# Patient Record
Sex: Female | Born: 1970
Health system: Southern US, Community
[De-identification: ages and names within clinical notes are randomized; demographics above are authoritative.]

## PROBLEM LIST (undated history)

## (undated) DIAGNOSIS — K589 Irritable bowel syndrome without diarrhea: Secondary | ICD-10-CM

## (undated) HISTORY — PX: TUBAL LIGATION: SHX77

## (undated) HISTORY — DX: Irritable bowel syndrome, unspecified: K58.9

---

## 1998-05-07 ENCOUNTER — Emergency Department (HOSPITAL_COMMUNITY): Admission: EM | Admit: 1998-05-07 | Discharge: 1998-05-07 | Payer: Self-pay | Admitting: Emergency Medicine

## 1998-10-26 ENCOUNTER — Emergency Department (HOSPITAL_COMMUNITY): Admission: EM | Admit: 1998-10-26 | Discharge: 1998-10-26 | Payer: Self-pay | Admitting: Emergency Medicine

## 1998-10-26 ENCOUNTER — Encounter: Payer: Self-pay | Admitting: Emergency Medicine

## 2001-11-17 ENCOUNTER — Other Ambulatory Visit: Admission: RE | Admit: 2001-11-17 | Discharge: 2001-11-17 | Payer: Self-pay | Admitting: Obstetrics and Gynecology

## 2005-06-05 ENCOUNTER — Emergency Department (HOSPITAL_COMMUNITY): Admission: EM | Admit: 2005-06-05 | Discharge: 2005-06-05 | Payer: Self-pay | Admitting: Emergency Medicine

## 2006-03-15 ENCOUNTER — Encounter: Admission: RE | Admit: 2006-03-15 | Discharge: 2006-03-15 | Payer: Self-pay | Admitting: Obstetrics & Gynecology

## 2006-03-22 ENCOUNTER — Encounter: Admission: RE | Admit: 2006-03-22 | Discharge: 2006-03-22 | Payer: Self-pay | Admitting: Obstetrics & Gynecology

## 2007-01-09 ENCOUNTER — Emergency Department (HOSPITAL_COMMUNITY): Admission: EM | Admit: 2007-01-09 | Discharge: 2007-01-09 | Payer: Self-pay | Admitting: Family Medicine

## 2009-06-13 ENCOUNTER — Encounter (INDEPENDENT_AMBULATORY_CARE_PROVIDER_SITE_OTHER): Payer: Self-pay | Admitting: *Deleted

## 2009-08-17 ENCOUNTER — Ambulatory Visit: Payer: Self-pay | Admitting: Internal Medicine

## 2009-08-17 DIAGNOSIS — F17200 Nicotine dependence, unspecified, uncomplicated: Secondary | ICD-10-CM | POA: Insufficient documentation

## 2009-08-17 DIAGNOSIS — N200 Calculus of kidney: Secondary | ICD-10-CM | POA: Insufficient documentation

## 2009-08-17 DIAGNOSIS — R1032 Left lower quadrant pain: Secondary | ICD-10-CM | POA: Insufficient documentation

## 2009-08-18 ENCOUNTER — Ambulatory Visit: Payer: Self-pay | Admitting: Cardiology

## 2009-09-13 ENCOUNTER — Telehealth: Payer: Self-pay | Admitting: Internal Medicine

## 2009-09-15 ENCOUNTER — Ambulatory Visit: Payer: Self-pay | Admitting: Internal Medicine

## 2009-09-23 ENCOUNTER — Ambulatory Visit: Payer: Self-pay | Admitting: Internal Medicine

## 2009-09-23 ENCOUNTER — Ambulatory Visit (HOSPITAL_COMMUNITY): Admission: RE | Admit: 2009-09-23 | Discharge: 2009-09-23 | Payer: Self-pay | Admitting: Internal Medicine

## 2009-09-23 ENCOUNTER — Encounter: Payer: Self-pay | Admitting: Internal Medicine

## 2009-09-26 ENCOUNTER — Encounter: Payer: Self-pay | Admitting: Internal Medicine

## 2009-11-14 ENCOUNTER — Ambulatory Visit: Payer: Self-pay | Admitting: Internal Medicine

## 2010-12-03 ENCOUNTER — Encounter: Payer: Self-pay | Admitting: Obstetrics & Gynecology

## 2010-12-12 NOTE — Assessment & Plan Note (Signed)
Summary: F.U AFTER COL ..E.M   History of Present Illness Visit Type: follow up  Primary GI MD: Lina Sar MD Primary Provider: Robert Bellow, MD Requesting Provider: n/a Chief Complaint: F/u from colon. Pt still having loose stools and bloating  History of Present Illness:   This is a 40 year old white female with irritable bowel syndrome and intermittent bloating as well as diarrhea. She was started on Paxil 10 mg daily and she is quite happy with the results. However her bloating  continues. A CT scan of the abdomen and pelvis was negative and her colonoscopy including terminal ileum biopsies was  negative. She has not responded to Bentyl. She feels that her symptoms are related to IBS. She would like to try modifying her diet and try  over-the-counter medications.   GI Review of Systems    Reports bloating.      Denies abdominal pain, acid reflux, belching, chest pain, dysphagia with liquids, dysphagia with solids, heartburn, loss of appetite, nausea, vomiting, vomiting blood, weight loss, and  weight gain.        Denies anal fissure, black tarry stools, change in bowel habit, constipation, diarrhea, diverticulosis, fecal incontinence, heme positive stool, hemorrhoids, irritable bowel syndrome, jaundice, light color stool, liver problems, rectal bleeding, and  rectal pain.    Current Medications (verified): 1)  Depakote 125 Mg Tbec (Divalproex Sodium) .... Preventative For Migraines As Needed 2)  Relpax 20 Mg Tabs (Eletriptan Hydrobromide) .... As Needed 3)  Kariva 0.15-0.02/0.01 Mg (21/5) Tabs (Desogestrel-Ethinyl Estradiol) .... As Directed 4)  Bentyl 10 Mg Caps (Dicyclomine Hcl) .... As Needed 5)  Excedrin Extra Strength [phone] Mg Tabs (Aspirin-Acetaminophen-Caffeine) .... As Needed 6)  Aleve 220 Mg Tabs (Naproxen Sodium) .... As Needed 7)  Paxil(Dosage Unknown) .... One Tablet By Mouth Once Daily  Allergies (verified): No Known Drug Allergies  Past History:  Past Medical  History: Reviewed history from 08/17/2009 and no changes required. Irritable Bowel Syndrome Kidney Stones  Past Surgical History: Reviewed history from 08/17/2009 and no changes required. Unremarkable  Family History: Family History of Breast Cancer:Mother Family History of Uterine Cancer:Mother Family History of Colitis/Crohn's:Father Family History of Diabetes: GM No FH of Colon Cancer:  Social History: Reviewed history from 08/17/2009 and no changes required. Occupation: Paramedic Patient currently smokes. 1/2 ppd Alcohol Use - no Daily Caffeine Use -3 Illicit Drug Use - no  Review of Systems  The patient denies allergy/sinus, anemia, anxiety-new, arthritis/joint pain, back pain, blood in urine, breast changes/lumps, change in vision, confusion, cough, coughing up blood, depression-new, fainting, fatigue, fever, headaches-new, hearing problems, heart murmur, heart rhythm changes, itching, menstrual pain, muscle pains/cramps, night sweats, nosebleeds, pregnancy symptoms, shortness of breath, skin rash, sleeping problems, sore throat, swelling of feet/legs, swollen lymph glands, thirst - excessive , urination - excessive , urination changes/pain, urine leakage, vision changes, and voice change.         Pertinent positive and negative review of systems were noted in the above HPI. All other ROS was otherwise negative.   Vital Signs:  Patient profile:   40 year old female Height:      66 inches Weight:      129 pounds BMI:     20.90 BSA:     1.66 Pulse rate:   84 / minute Pulse rhythm:   regular BP sitting:   112 / 72  (left arm) Cuff size:   regular  Vitals Entered By: Ok Anis CMA (November 14, 2009 11:05 AM)  Impression & Recommendations:  Problem # 1:  ABDOMINAL PAIN, LEFT LOWER QUADRANT (ICD-789.04) dyspepsia bloating and abdominal pain likely due to   irritable bowel syndrome. I have suggested that she uses  probiotics or pancreatic enzyme supplement such as  Phazyme-95. I have also suggested charcoal or Pepto-Bismol p.r.n. for exccessive gas absorption. She  will continue on Paxil 10 mg daily and will return p.r.n.  Patient Instructions: 1)  high-fiber diet. 2)  Paxil 10 mg daily. 3)  Trial of probiotics or pancreatic enzyme supplements such as Phazyme -95 4)  Samples of Amitiza 8 mcg take one a day on p.r.n. basis, will call if prescription needed. 5)  Copy sent to : Dr Robert Bellow

## 2010-12-15 ENCOUNTER — Emergency Department (HOSPITAL_BASED_OUTPATIENT_CLINIC_OR_DEPARTMENT_OTHER)
Admission: EM | Admit: 2010-12-15 | Discharge: 2010-12-16 | Disposition: A | Discharge status: Home / Self Care | Payer: 59 | Attending: Emergency Medicine | Admitting: Emergency Medicine

## 2010-12-15 DIAGNOSIS — G43801 Other migraine, not intractable, with status migrainosus: Secondary | ICD-10-CM | POA: Insufficient documentation

## 2010-12-15 DIAGNOSIS — Z79899 Other long term (current) drug therapy: Secondary | ICD-10-CM | POA: Insufficient documentation

## 2010-12-15 DIAGNOSIS — K5090 Crohn's disease, unspecified, without complications: Secondary | ICD-10-CM | POA: Insufficient documentation

## 2011-11-23 ENCOUNTER — Encounter (INDEPENDENT_AMBULATORY_CARE_PROVIDER_SITE_OTHER): Payer: 59 | Admitting: Family Medicine

## 2011-11-23 DIAGNOSIS — N942 Vaginismus: Secondary | ICD-10-CM

## 2011-11-23 DIAGNOSIS — Z309 Encounter for contraceptive management, unspecified: Secondary | ICD-10-CM

## 2011-11-23 DIAGNOSIS — Z0000 Encounter for general adult medical examination without abnormal findings: Secondary | ICD-10-CM

## 2011-11-23 DIAGNOSIS — N760 Acute vaginitis: Secondary | ICD-10-CM

## 2011-11-23 LAB — BASIC METABOLIC PANEL: BUN: 10 mg/dL (ref 4–21)

## 2011-11-23 LAB — HEPATIC FUNCTION PANEL
AST: 14 U/L (ref 13–35)
Alkaline Phosphatase: 44 U/L (ref 25–125)
Bilirubin, Total: 0.3 mg/dL

## 2011-11-23 LAB — CBC AND DIFFERENTIAL
HCT: 41 % (ref 36–46)
Hemoglobin: 14.4 g/dL (ref 12.0–16.0)
Platelets: 250 10*3/uL (ref 150–399)

## 2011-12-28 ENCOUNTER — Ambulatory Visit (INDEPENDENT_AMBULATORY_CARE_PROVIDER_SITE_OTHER): Payer: 59 | Admitting: Family Medicine

## 2011-12-28 ENCOUNTER — Encounter: Payer: Self-pay | Admitting: Family Medicine

## 2011-12-28 VITALS — BP 113/71 | HR 79 | Temp 98.4°F | Resp 16 | Ht 64.75 in | Wt 128.6 lb

## 2011-12-28 DIAGNOSIS — Z309 Encounter for contraceptive management, unspecified: Secondary | ICD-10-CM

## 2011-12-28 DIAGNOSIS — Z01419 Encounter for gynecological examination (general) (routine) without abnormal findings: Secondary | ICD-10-CM

## 2011-12-28 DIAGNOSIS — Z3040 Encounter for surveillance of contraceptives, unspecified: Secondary | ICD-10-CM

## 2011-12-28 MED ORDER — DESOGESTREL-ETHINYL ESTRADIOL 0.15-0.02/0.01 MG (21/5) PO TABS: 1.0000 | ORAL_TABLET | Freq: Every day | ORAL | Status: DC

## 2011-12-28 NOTE — Patient Instructions (Signed)
Your Oral Contraceptive has been refilled for 12 months. Please contact the office if you have any problems.    Pap Test A Pap test is a sampling of cells from a woman's cervix. The cervix is the opening between the vagina (birth canal) and the uterus (the bottom part of the womb). The cells are scraped from the cervix during a pelvic exam. These cells are then looked at under a microscope to see if the cells are normal or to see if a cancer is developing or there are changes that suggest a cancer will develop. Cervical dysplasia is a condition in which a woman has abnormal changes in the top layer of cells of her cervix. These changes are an early sign that cervical cancer may develop. Pap tests also look for the human papilloma virus (HPV) because it has 4 types that are responsible for 70% of cervical cancer. Infections can also be found during a Pap test such as bacteria, fungus, protozoa and viruses.  Cervical cancer is harder to treat and less likely to have a good outcome if left untreated. Catching the disease at an early stage leads to a better outcome. Since the Pap test was introduced 60 years ago, deaths from cervical cancer have decreased by 70%. Every woman should keep up to date with Pap tests. RISK FACTORS FOR CERVICAL CANCER INCLUDE:   Becoming sexually active before age 66.   Being the daughter of a woman who took diethylstilbestrol (DES) during pregnancy.   Having a sexual partner who has or has had cancer of the penis.   Having a sexual partner whose past partner had cervical cancer or cervical dysplasia (early cell changes which suggest a cancer may develop).   Having a weakened immune system. An example would be HIV or other immunodeficiency disorder.   Having had a sexually transmitted infection such as chlamydia, gonorrhea or HPV.   Having had an abnormal Pap or cancer of the vagina or vulva.   Having had more than one sexual partner.   A history of cervical cancer in  a woman's sister or mother.   Not using condoms with new sexual partners.   Smoking.  WHO SHOULD HAVE PAP TESTS  A Pap test is done to screen for cervical cancer.   The first Pap test should be done at age 45.   Between ages 6 and 49, Pap tests are repeated every 2 years.   Beginning at age 37, you are advised to have a Pap test every 3 years as long as your past 3 Pap tests have been normal.   Some women have medical problems that increase the chance of getting cervical cancer. Talk to your caregiver about these problems. It is especially important to talk to your caregiver if a new problem develops soon after your last Pap test. In these cases, your caregiver may recommend more frequent screening and Pap tests.   The above recommendations are the same for women who have or have not gotten the vaccine for HPV (Human Papillomavirus).   If you had a hysterectomy for a problem that was not a cancer or a condition that could lead to cancer, then you no longer need Pap tests. However, even if you no longer need a Pap test, a regular exam is a good idea to make sure no other problems are starting.    If you are between ages 90 and 1, and you have had normal Pap tests going back 10 years, you no  longer need Pap tests. However, even if you no longer need a Pap test, a regular exam is a good idea to make sure no other problems are starting.    If you have had past treatment for cervical cancer or a condition that could lead to cancer, you need Pap tests and screening for cancer for at least 20 years after your treatment.   If Pap tests have been discontinued, risk factors (such as a new sexual partner) need to be re-assessed to determine if screening should be resumed.   Some women may need screenings more often if they are at high risk for cervical cancer.  PREPARATION FOR A PAP TEST A Pap test should be performed during the weeks before the start of menstruation. Women should not  douche or have sexual intercourse for 24 hours before the test. No vaginal creams, diaphragms, or tampons should be used for 24 hours before the test. To minimize discomfort, a woman should empty her bladder just before the exam. TAKING THE PAP TEST The caregiver will perform a pelvic exam. A metal or plastic instrument (speculum) is placed in the vagina. This is done before your caregiver does a bimanual exam of your internal female organs. This instrument allows your caregiver to see the inside of the vagina and look at the cervix. A small, sterile brush is used to take a sample of cells from the internal opening of the cervix. A small wooden spatula is used to scrape the outside of the cervix. Neither of these two methods to collect cells will cause you pain. These two scrapings are placed on a glass slide or in a small bottle filled with a special liquid. The cells are looked at later under a microscope in a lab. A specialist will look at these cells and determine if the cells are normal. RESULTS OF YOUR PAP TEST  A healthy Pap test shows no abnormal cells or evidence of inflammation.   The presence of abnormally growing cells on the surface of the cervix may be reported as an abnormal Pap test. Different categories of findings are used to describe your Pap test. Your caregiver will go over the importance of these findings with you. The caregiver will then determine what follow-up is needed or when you should have your next pap test.   If you have had two or more abnormal Pap tests:   You may be asked to have a colposcopy. This is a test in which the cervix is viewed with a special lighted microscope.   A cervical tissue sample (biopsy) may also be needed. This involves taking a small tissue sample from the cervix. The sample is looked at under a microscope to find the cause of the abnormal cells. Make sure you find out the results of the Pap test. If you have not received the results within two  weeks, contact your caregiver's office for the results. Do not assume everything is normal if you have not heard from your caregiver or medical facility. It is important to follow up on all of your test results.  Document Released: 01/19/2003 Document Revised: 07/11/2011 Document Reviewed: 03/22/2008 [facility] Patient Information 2012 [place], [place].

## 2011-12-28 NOTE — Progress Notes (Signed)
  Subjective:    Patient ID: Jennifer French, female    DOB: 10/02/1971, 41 y.o.   MRN: [id number]  HPI  This 41 y.o Cauc female returns today for PAP; last visit, exam could not be performed due to excessive   vaginal discharge and pain. The pt reports normal period, no intercourse, no abnormal discharge.  She requests RF for OCPs which she is taking w/o problems.    Review of Systems Noncontributory     Objective:   Physical Exam  Nursing note and vitals reviewed. Constitutional: She appears well-developed and well-nourished.  HENT:  Head: Normocephalic and atraumatic.  Genitourinary: Vagina normal and uterus normal. There is no rash, tenderness or lesion on the right labia. There is no rash, tenderness or lesion on the left labia. Cervix exhibits no motion tenderness, no discharge and no friability. Right adnexum displays no mass and no tenderness. Left adnexum displays no mass and no tenderness. No erythema, tenderness or bleeding around the vagina. No vaginal discharge found.    Pap collected      Assessment & Plan:   1. Pap smear, as part of routine gynecological examination  Pap IG, CT/NG w/ reflex HPV when ASC-U  2. Contraception management  Rx. Kariva (generic) x 12 months.

## 2012-01-01 NOTE — Progress Notes (Signed)
Quick Note:  Notify pt of Normal results. ______ 

## 2012-01-02 NOTE — Progress Notes (Signed)
Quick Note:  Notify pt of Normal results. ______ 

## 2012-06-18 ENCOUNTER — Ambulatory Visit (INDEPENDENT_AMBULATORY_CARE_PROVIDER_SITE_OTHER): Payer: 59 | Admitting: Emergency Medicine

## 2012-06-18 VITALS — BP 116/78 | HR 93 | Temp 98.7°F | Resp 16 | Ht 65.25 in | Wt 121.0 lb

## 2012-06-18 DIAGNOSIS — B029 Zoster without complications: Secondary | ICD-10-CM

## 2012-06-18 MED ORDER — VALACYCLOVIR HCL 1 G PO TABS: 1000.0000 mg | ORAL_TABLET | Freq: Three times a day (TID) | ORAL | Status: AC

## 2012-06-18 NOTE — Progress Notes (Signed)
  Subjective:    Patient ID: Jennifer French, female    DOB: 12/10/1970, 41 y.o.   MRN: [id number]  HPI History of previous outbreak of shingles.  Now has recurrent outbreak on right arm.  No fever or chills   Review of Systems As per HPI, otherwise negative.       Objective:   Physical Exam   GEN: WDWN, NAD, Non-toxic, Alert & Oriented x 3 HEENT: Atraumatic, Normocephalic.  Ears and Nose: No external deformity. EXTR: No clubbing/cyanosis/edema NEURO: Normal gait.  PSYCH: Normally interactive. Conversant. Not depressed or anxious appearing.  Calm demeanor.  Skin shingles rash on right upper arm      Assessment & Plan:  Shingles famvir vicodin Follow up as needed

## 2012-11-03 ENCOUNTER — Other Ambulatory Visit: Payer: Self-pay | Admitting: Family Medicine

## 2012-11-19 ENCOUNTER — Ambulatory Visit (INDEPENDENT_AMBULATORY_CARE_PROVIDER_SITE_OTHER): Payer: 59 | Admitting: Family Medicine

## 2012-11-19 ENCOUNTER — Ambulatory Visit: Payer: 59

## 2012-11-19 ENCOUNTER — Encounter: Payer: Self-pay | Admitting: Physician Assistant

## 2012-11-19 VITALS — BP 120/75 | HR 88 | Temp 99.1°F | Resp 18 | Wt 123.0 lb

## 2012-11-19 DIAGNOSIS — M546 Pain in thoracic spine: Secondary | ICD-10-CM

## 2012-11-19 DIAGNOSIS — M549 Dorsalgia, unspecified: Secondary | ICD-10-CM

## 2012-11-19 DIAGNOSIS — Z3041 Encounter for surveillance of contraceptive pills: Secondary | ICD-10-CM

## 2012-11-19 MED ORDER — HYDROCODONE-ACETAMINOPHEN 5-325 MG PO TABS: 1.0000 | ORAL_TABLET | Freq: Three times a day (TID) | ORAL | Status: DC | PRN

## 2012-11-19 MED ORDER — DESOGESTREL-ETHINYL ESTRADIOL 0.15-0.02/0.01 MG (21/5) PO TABS: 1.0000 | ORAL_TABLET | Freq: Every day | ORAL | Status: DC

## 2012-11-19 MED ORDER — CYCLOBENZAPRINE HCL 5 MG PO TABS: 5.0000 mg | ORAL_TABLET | Freq: Three times a day (TID) | ORAL | Status: DC | PRN

## 2012-11-19 MED ORDER — MELOXICAM 15 MG PO TABS: 15.0000 mg | ORAL_TABLET | Freq: Every day | ORAL | Status: DC

## 2012-11-19 NOTE — Patient Instructions (Addendum)
[facility] [address] [place], [place], [place], [phone] [facility] - [address], [place], [place] [address], [phone]

## 2012-11-19 NOTE — Progress Notes (Signed)
[address] [place] [address]   Phone [phone]  Subjective:    Patient ID: Jennifer French, female    DOB: 1971-05-01, 42 y.o.   MRN: [id number]  HPI  Pt presents to clinic with 6 month h/o mid to low back pain.  The pain started just after her shifts at work which include heavy lifting and bending over.  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  She is still having her back pain that is definitely worse with increased activity.  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  She has been using her Vicodin for her headaches for her terrible back pain.  She has used some of her dads muscle relaxers and that gives her some relief from her pain.  She uses heat and that really helps with her pain.  She has not had any injury that she knows of to her back.  The pain is on both sides and not over the bones of her back.  She really feels this is muscle related but she just cannot deal with the pain anymore.  Sometimes the pain goes into her hips but never into her legs.  She has no paresthesias.  She also needs a refill on her birth control pills.  She is not interested in becoming pregnant and she has no kids.  She would like to know if there are other longer term methods - she has no trouble taking her pills and she thinks that they may help with her headaches.  Review of Systems  Musculoskeletal: Positive for back pain. Negative for gait problem.       Objective:   Physical Exam  Vitals reviewed. Constitutional: She is oriented to person, place, and time. She appears well-developed and well-nourished.  HENT:  Head: Normocephalic and atraumatic.  Right Ear: External ear normal.  Left Ear: External ear normal.  Pulmonary/Chest: Effort normal.  Musculoskeletal:       Thoracic back: She exhibits spasm. She exhibits normal range of motion and no bony tenderness.       Back:  Neurological: She is alert and oriented to  person, place, and time. She has normal reflexes.  Skin: Skin is warm and dry.  Psychiatric: She has a normal mood and affect. Her behavior is normal. Judgment and thought content normal.   [facility] reading (PRIMARY) by  Dr. Neva Seat.  Mild scoliosis mid thoracic area.      Assessment & Plan:   1. Thoracic back pain  DG Lumbar Spine 2-3 Views, DG Thoracic Spine 2 View, cyclobenzaprine (FLEXERIL) 5 MG tablet, HYDROcodone-acetaminophen (NORCO/VICODIN) 5-325 MG per tablet, meloxicam (MOBIC) 15 MG tablet  2. Back pain  DG Lumbar Spine 2-3 Views, DG Thoracic Spine 2 View, cyclobenzaprine (FLEXERIL) 5 MG tablet, HYDROcodone-acetaminophen (NORCO/VICODIN) 5-325 MG per tablet, meloxicam (MOBIC) 15 MG tablet  3. 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  She has typical musculoskeletal pain that has been relieved with muscle relaxers.  She should continue heat and start above meds.  Names of chiropractors were given to her because she is interested in that route to help with pain.  Back manual for home exercise was given to patient.  She should start to work on core exercises to help support her back. 3- d/w pt  other birth control options - if she is interested in the future she will call for a referral but for now she wants to continue with OCP.

## 2012-11-20 ENCOUNTER — Encounter: Payer: Self-pay | Admitting: Physician Assistant

## 2012-11-21 NOTE — Progress Notes (Signed)
Xray read and patient discussed with Sarah Weber, PA-C. Agree with assessment and plan of care per her note.   

## 2012-12-01 ENCOUNTER — Encounter: Payer: Self-pay | Admitting: Physician Assistant

## 2012-12-01 DIAGNOSIS — G43909 Migraine, unspecified, not intractable, without status migrainosus: Secondary | ICD-10-CM | POA: Insufficient documentation

## 2012-12-01 DIAGNOSIS — K589 Irritable bowel syndrome without diarrhea: Secondary | ICD-10-CM | POA: Insufficient documentation

## 2012-12-20 ENCOUNTER — Other Ambulatory Visit: Payer: Self-pay | Admitting: Physician Assistant

## 2012-12-22 NOTE — Telephone Encounter (Signed)
At Tl desk 

## 2013-01-14 ENCOUNTER — Encounter: Payer: Self-pay | Admitting: Physician Assistant

## 2013-01-14 ENCOUNTER — Ambulatory Visit (INDEPENDENT_AMBULATORY_CARE_PROVIDER_SITE_OTHER): Payer: 59 | Admitting: Physician Assistant

## 2013-01-14 VITALS — BP 121/73 | HR 98 | Temp 99.2°F | Resp 16 | Ht 65.0 in | Wt 125.6 lb

## 2013-01-14 DIAGNOSIS — M549 Dorsalgia, unspecified: Secondary | ICD-10-CM

## 2013-01-14 DIAGNOSIS — R1032 Left lower quadrant pain: Secondary | ICD-10-CM

## 2013-01-14 DIAGNOSIS — M546 Pain in thoracic spine: Secondary | ICD-10-CM

## 2013-01-14 MED ORDER — HYDROCODONE-ACETAMINOPHEN 5-325 MG PO TABS: 1.0000 | ORAL_TABLET | Freq: Once | ORAL | Status: DC

## 2013-01-14 MED ORDER — CYCLOBENZAPRINE HCL 5 MG PO TABS: 5.0000 mg | ORAL_TABLET | Freq: Three times a day (TID) | ORAL | Status: DC | PRN

## 2013-01-14 MED ORDER — MELOXICAM 15 MG PO TABS: 15.0000 mg | ORAL_TABLET | Freq: Every day | ORAL | Status: DC

## 2013-01-14 NOTE — Progress Notes (Signed)
   [address], [place] [address]   Phone [phone]  Subjective:    Patient ID: Jennifer French, female    DOB: 26-Mar-1971, 42 y.o.   MRN: [id number]  HPI Pt presents to clinic for recheck. Her back pain is better but she can only take the flexeril in the afternoon and night because in the am it just makes her to tired.  In the am she wakes up (she is comfortable when she sleeps and she has a really good mattress) but she is so stiff and it hurts to move.  She ends up taking a 1/2 Norco and she can get moving, without it she hurts to much.  She has done some back exercises but has not been to PT or chiro but is open to it if it will maybe help her pain.  The pain has not changed from her last visit.   Review of Systems  Musculoskeletal: Positive for back pain.       Objective:   Physical Exam  Vitals reviewed. Constitutional: She is oriented to person, place, and time. She appears well-developed and well-nourished.  Pulmonary/Chest: Effort normal.  Neurological: She is alert and oriented to person, place, and time.  Skin: Skin is warm and dry.  Psychiatric: She has a normal mood and affect. Her behavior is normal. Judgment and thought content normal.      Assessment & Plan:  Thoracic back pain - Plan: cyclobenzaprine (FLEXERIL) 5 MG tablet, meloxicam (MOBIC) 15 MG tablet, HYDROcodone-acetaminophen (NORCO/VICODIN) 5-325 MG per tablet, Ambulatory referral to Physical Therapy  Back pain - Plan: cyclobenzaprine (FLEXERIL) 5 MG tablet, meloxicam (MOBIC) 15 MG tablet, HYDROcodone-acetaminophen (NORCO/VICODIN) 5-325 MG per tablet, Ambulatory referral to Physical Therapy  D/w pt that we would rather not have her need to take Norco daily and will send her for PT in hopes that we can improve her muscle strength and decrease her pain.

## 2013-01-17 ENCOUNTER — Other Ambulatory Visit: Payer: Self-pay | Admitting: Physician Assistant

## 2013-03-06 ENCOUNTER — Telehealth: Payer: Self-pay

## 2013-03-06 DIAGNOSIS — M546 Pain in thoracic spine: Secondary | ICD-10-CM

## 2013-03-06 DIAGNOSIS — M549 Dorsalgia, unspecified: Secondary | ICD-10-CM

## 2013-03-06 MED ORDER — HYDROCODONE-ACETAMINOPHEN 5-325 MG PO TABS: 1.0000 | ORAL_TABLET | Freq: Once | ORAL | Status: DC

## 2013-03-06 NOTE — Telephone Encounter (Signed)
Patient would like [place] to call in a hydrocodone refill to [facility]. She changed pharmacy and since they don't have any history on the patient they need [place] to call them for the refill.

## 2013-03-06 NOTE — Telephone Encounter (Signed)
[facility]- [place]. Called rx into pharm- pt aware.

## 2013-03-06 NOTE — Telephone Encounter (Signed)
Med is ready to be called into the pharmacy.  Please ask her how the PT is doing and has it helped.

## 2013-04-30 ENCOUNTER — Telehealth: Payer: Self-pay | Admitting: Radiology

## 2013-04-30 DIAGNOSIS — M546 Pain in thoracic spine: Secondary | ICD-10-CM

## 2013-04-30 DIAGNOSIS — M549 Dorsalgia, unspecified: Secondary | ICD-10-CM

## 2013-04-30 MED ORDER — HYDROCODONE-ACETAMINOPHEN 5-325 MG PO TABS: 1.0000 | ORAL_TABLET | Freq: Once | ORAL | Status: DC

## 2013-04-30 NOTE — Telephone Encounter (Signed)
Please advise on refill of hydrocodone received fax from [facility]

## 2013-04-30 NOTE — Telephone Encounter (Signed)
At Tl desk 

## 2013-05-01 ENCOUNTER — Other Ambulatory Visit: Payer: Self-pay | Admitting: Radiology

## 2013-05-01 NOTE — Telephone Encounter (Signed)
Verified sig/ one q 8 hrs.

## 2013-05-01 NOTE — Telephone Encounter (Signed)
Faxed

## 2013-06-03 ENCOUNTER — Telehealth: Payer: Self-pay

## 2013-06-03 DIAGNOSIS — M549 Dorsalgia, unspecified: Secondary | ICD-10-CM

## 2013-06-03 DIAGNOSIS — M546 Pain in thoracic spine: Secondary | ICD-10-CM

## 2013-06-03 NOTE — Telephone Encounter (Signed)
[facility] pharmacy is calling to request a refill on hydrocodone for this pt. Best# [phone]

## 2013-06-04 NOTE — Telephone Encounter (Signed)
Please advise 

## 2013-06-05 MED ORDER — HYDROCODONE-ACETAMINOPHEN 5-325 MG PO TABS: 1.0000 | ORAL_TABLET | Freq: Once | ORAL | Status: DC

## 2013-06-05 NOTE — Addendum Note (Signed)
Addended by: Morrell Riddle on: 06/05/2013 11:46 AM   Modules accepted: Orders

## 2013-06-05 NOTE — Telephone Encounter (Signed)
Ready

## 2013-06-05 NOTE — Telephone Encounter (Signed)
faxed

## 2013-07-17 ENCOUNTER — Other Ambulatory Visit: Payer: Self-pay | Admitting: Physician Assistant

## 2013-07-18 ENCOUNTER — Telehealth: Payer: Self-pay

## 2013-07-18 DIAGNOSIS — M549 Dorsalgia, unspecified: Secondary | ICD-10-CM

## 2013-07-18 DIAGNOSIS — M546 Pain in thoracic spine: Secondary | ICD-10-CM

## 2013-07-18 NOTE — Telephone Encounter (Signed)
Pt is going out of town needs refill on hydrocodone. This time [facility] [address] because her usual pharmacy is closed tomorrow. Please call when ready at [phone].

## 2013-07-20 ENCOUNTER — Telehealth: Payer: Self-pay

## 2013-07-20 MED ORDER — HYDROCODONE-ACETAMINOPHEN 5-325 MG PO TABS: 1.0000 | ORAL_TABLET | Freq: Once | ORAL | Status: DC

## 2013-07-20 NOTE — Telephone Encounter (Signed)
Pt states she is returning a call made to her today. Unable to answer phone in time.  I don't see documentation of who called pt and clinical tl unavailable at the time of this call. Pt does have a previous message about rx refill. Please call pt.  [phone]  bf

## 2013-07-20 NOTE — Telephone Encounter (Signed)
Pt called back and advised she would like her Rx to be sent to [facility] if possible. Also stated that she is due to go out of town tomorrow and would love to get Rx filled tonight if possible.

## 2013-07-20 NOTE — Telephone Encounter (Signed)
I did not call her. Looks like Jennifer French called her about the Rx. To Jennifer French

## 2013-07-20 NOTE — Telephone Encounter (Signed)
See notes under Refill encounter.

## 2013-07-20 NOTE — Telephone Encounter (Signed)
Ready

## 2013-07-20 NOTE — Telephone Encounter (Signed)
Received a req for RF of hydrocodone from [facility]. Sarah, since original call from pt was on Sat and she was referring to [facility] being closed on Sunday, I assume she would rather have it go to [facility] now. I called pt and advised her that I will have Sarah send to [facility] if approved and asked pt to CB if she still wants [facility].

## 2013-07-21 ENCOUNTER — Telehealth: Payer: Self-pay | Admitting: Radiology

## 2013-07-21 NOTE — Telephone Encounter (Signed)
I fixed it

## 2013-07-21 NOTE — Telephone Encounter (Signed)
Faxed to [facility], advised Rx faxed. Pt understood.

## 2013-07-21 NOTE — Telephone Encounter (Signed)
Next time you see this patient or send in her hydrocodone, please change sig, it was sent with sig to take once, I have changed at pharmacy last Rx and this Rx to you [facility]

## 2013-07-29 ENCOUNTER — Ambulatory Visit (INDEPENDENT_AMBULATORY_CARE_PROVIDER_SITE_OTHER): Payer: 59 | Admitting: Physician Assistant

## 2013-07-29 ENCOUNTER — Encounter: Payer: Self-pay | Admitting: Physician Assistant

## 2013-07-29 VITALS — BP 113/71 | HR 93 | Temp 99.3°F | Resp 17 | Wt 130.0 lb

## 2013-07-29 DIAGNOSIS — N949 Unspecified condition associated with female genital organs and menstrual cycle: Secondary | ICD-10-CM

## 2013-07-29 DIAGNOSIS — N3289 Other specified disorders of bladder: Secondary | ICD-10-CM

## 2013-07-29 DIAGNOSIS — M549 Dorsalgia, unspecified: Secondary | ICD-10-CM

## 2013-07-29 DIAGNOSIS — Z1231 Encounter for screening mammogram for malignant neoplasm of breast: Secondary | ICD-10-CM

## 2013-07-29 DIAGNOSIS — R3989 Other symptoms and signs involving the genitourinary system: Secondary | ICD-10-CM

## 2013-07-29 DIAGNOSIS — M546 Pain in thoracic spine: Secondary | ICD-10-CM

## 2013-07-29 DIAGNOSIS — F411 Generalized anxiety disorder: Secondary | ICD-10-CM

## 2013-07-29 DIAGNOSIS — N898 Other specified noninflammatory disorders of vagina: Secondary | ICD-10-CM

## 2013-07-29 DIAGNOSIS — Z0000 Encounter for general adult medical examination without abnormal findings: Secondary | ICD-10-CM

## 2013-07-29 DIAGNOSIS — R102 Pelvic and perineal pain: Secondary | ICD-10-CM

## 2013-07-29 LAB — CBC WITH DIFFERENTIAL/PLATELET
Basophils Absolute: 0 10*3/uL (ref 0.0–0.1)
Lymphocytes Relative: 48 % — ABNORMAL HIGH (ref 12–46)
Neutro Abs: 4.9 10*3/uL (ref 1.7–7.7)
Neutrophils Relative %: 43 % (ref 43–77)
Platelets: 281 10*3/uL (ref 150–400)
RDW: 12.9 % (ref 11.5–15.5)
WBC: 11.5 10*3/uL — ABNORMAL HIGH (ref 4.0–10.5)

## 2013-07-29 LAB — POCT WET PREP WITH KOH: Trichomonas, UA: NEGATIVE

## 2013-07-29 LAB — POCT URINALYSIS DIPSTICK
Bilirubin, UA: NEGATIVE
Leukocytes, UA: NEGATIVE
Nitrite, UA: NEGATIVE
Protein, UA: NEGATIVE
pH, UA: 6.5

## 2013-07-29 MED ORDER — METRONIDAZOLE 500 MG PO TABS: 500.0000 mg | ORAL_TABLET | Freq: Two times a day (BID) | ORAL | Status: AC

## 2013-07-29 MED ORDER — BUPROPION HCL ER (XL) 150 MG PO TB24: 150.0000 mg | ORAL_TABLET | Freq: Every day | ORAL | Status: DC

## 2013-07-29 MED ORDER — LIDOCAINE HCL 2 % EX GEL: CUTANEOUS | Status: DC | PRN

## 2013-07-29 MED ORDER — HYDROCODONE-ACETAMINOPHEN 5-325 MG PO TABS: 1.0000 | ORAL_TABLET | Freq: Every day | ORAL | Status: DC | PRN

## 2013-07-29 MED ORDER — ALPRAZOLAM 0.25 MG PO TABS: 0.2500 mg | ORAL_TABLET | Freq: Every day | ORAL | Status: DC | PRN

## 2013-07-29 MED ORDER — MELOXICAM 15 MG PO TABS: 15.0000 mg | ORAL_TABLET | Freq: Every day | ORAL | Status: DC

## 2013-07-29 NOTE — Patient Instructions (Signed)
Send to Dr Audie Box - [place] [facility]  -try lidocaine gel  -Xanax before the appt and ultrasound  Send for mammogram  For back   - more hydrocodone and flexeril  -refer to [facility]  For smoking  -wellbutrin 150mg  daily for 1 wk and then increase to 2 pills daily - call me in 1 month

## 2013-07-30 ENCOUNTER — Telehealth: Payer: Self-pay

## 2013-07-30 LAB — TSH: TSH: 1.302 u[IU]/mL (ref 0.350–4.500)

## 2013-07-30 LAB — COMPREHENSIVE METABOLIC PANEL
ALT: 14 U/L (ref 0–35)
AST: 15 U/L (ref 0–37)
Chloride: 105 mEq/L (ref 96–112)
Creat: 0.77 mg/dL (ref 0.50–1.10)
Sodium: 137 mEq/L (ref 135–145)
Total Bilirubin: 0.2 mg/dL — ABNORMAL LOW (ref 0.3–1.2)
Total Protein: 6.9 g/dL (ref 6.0–8.3)

## 2013-07-30 LAB — LIPID PANEL
Total CHOL/HDL Ratio: 2.7 Ratio
VLDL: 17 mg/dL (ref 0–40)

## 2013-07-30 NOTE — Telephone Encounter (Signed)
I sent some lidocaine gel to the pharmacy and I think that is what she is talking about - it is for her vaginal pain.

## 2013-07-30 NOTE — Progress Notes (Signed)
[address] [place] [address]   Phone [phone]  Subjective:    Patient ID: Jennifer French, female    DOB: 1971-02-08, 42 y.o.   MRN: [id number]  HPI Pt presents to clinic for CPE.  She had her Pap last year.  She has no had a mammogram in 2008 for fibrous breasts.  She is here with several issues today and is fasting.  1- Back pain continues - she is using norco most days.  She only takes it at night because she is uncomfortable with taking it at work but her pain seems to not be getting better.  She did 6 weeks of PT and right after the sessions the pain was better but overall her back pain did not improve - but her headaches got significantly better after PT and they have stayed better.  The flexeril definitely helps with her back pain but she only takes bid due to hesitant to take it at work.  2- She has had increased anxiety recently - mom was recently hospitalized from V tach unknown cause and she is still in the hospital - she had a clean cath today and she is unsure when she is going to be released  3- still smoking - she has promised her mom she is going to quit smoking - she knows she needs help because she uses it to treat her anxiety and with that being increased currently she is not sure she can go without  4- vaginal pain - she is having extreme pain with intercourse - it started in May and got so bad that she had to stop having intercourse with her husband.  She has pain mainly with entry - fingers even cause the pain.  She has never been able to use tampons because they fall out.  She has not had trauma to her vaginal area.  She is not having incontinence but she does feel pressure to urinate a lot and it is significantly increase during penetration of her vagina to the point that pt feels like she is going to urinate on her husband.  She now knows she is going to have pain and is almost afraid to have sex because she knows it is going to hurt.  She is not having any  vaginal discharge that she knows of.  She is having no urinary symptoms like dysuria or frequency.  Her menses are normal for her.  Review of Systems  Genitourinary: Positive for vaginal pain and pelvic pain. Negative for vaginal bleeding and vaginal discharge.  Musculoskeletal: Positive for back pain.  Psychiatric/Behavioral: The patient is nervous/anxious.        Objective:   Physical Exam  Vitals reviewed. Constitutional: She is oriented to person, place, and time. She appears well-developed and well-nourished.  HENT:  Head: Normocephalic and atraumatic.  Right Ear: Hearing, tympanic membrane, external ear and ear canal normal.  Left Ear: Hearing, tympanic membrane, external ear and ear canal normal.  Nose: Nose normal.  Mouth/Throat: Uvula is midline, oropharynx is clear and moist and mucous membranes are normal.  Eyes: Conjunctivae and EOM are normal. Pupils are equal, round, and reactive to light.  Neck: Normal range of motion. Neck supple.  Cardiovascular: Normal rate, regular rhythm and normal heart sounds.   No murmur heard. Pulmonary/Chest: Effort normal and breath sounds normal.  Abdominal: Soft. Bowel sounds are normal.  Genitourinary: Uterus normal.    No breast swelling, tenderness, discharge or bleeding. Pelvic exam was performed with patient  supine. No labial fusion. There is no rash, tenderness, lesion or injury on the right labia. There is no rash, tenderness, lesion or injury on the left labia. Cervix exhibits no discharge. Right adnexum displays no mass, no tenderness and no fullness. Left adnexum displays no mass, no tenderness and no fullness. There is tenderness around the vagina.  Pt has significant pain (burning sensation) upon digital exam entry of introitus.  She is tense throughout the exam and muscle tension changes with her relaxation.Most of her pain is at 3, 9 and 12 o'clock.  Vaginal mucosa is slightly pale but does not appear dry or atrophied.  During  the bimanual exam she feels intense bladder pressure.  The exam was difficult due to pain - she does not have CMT or adnexal TTP with the limited exam that I was able to perform.  Musculoskeletal: Normal range of motion.       Lumbar back: She exhibits tenderness and spasm (more on the right side). She exhibits normal range of motion and no bony tenderness.  Lymphadenopathy:    She has no cervical adenopathy.  Neurological: She is alert and oriented to person, place, and time. She has normal strength and normal reflexes. No cranial nerve deficit or sensory deficit.  Skin: Skin is warm and dry.  Psychiatric: She has a normal mood and affect. Her behavior is normal. Judgment and thought content normal.   Results for orders placed in visit on 07/29/13  COMPREHENSIVE METABOLIC PANEL      Result Value Range   Sodium 137  135 - 145 mEq/L   Potassium 4.0  3.5 - 5.3 mEq/L   Chloride 105  96 - 112 mEq/L   CO2 24  19 - 32 mEq/L   Glucose, Bld 80  70 - 99 mg/dL   BUN 8  6 - 23 mg/dL   Creat 1.19  1.47 - 8.29 mg/dL   Total Bilirubin 0.2 (*) 0.3 - 1.2 mg/dL   Alkaline Phosphatase 44  39 - 117 U/L   AST 15  0 - 37 U/L   ALT 14  0 - 35 U/L   Total Protein 6.9  6.0 - 8.3 g/dL   Albumin 4.0  3.5 - 5.2 g/dL   Calcium 9.0  8.4 - 56.2 mg/dL  LIPID PANEL      Result Value Range   Cholesterol 163  0 - 200 mg/dL   Triglycerides 87  <130 mg/dL   HDL 60  >86 mg/dL   Total CHOL/HDL Ratio 2.7     VLDL 17  0 - 40 mg/dL   LDL Cholesterol 86  0 - 99 mg/dL  TSH      Result Value Range   TSH 1.302  0.350 - 4.500 uIU/mL  CBC WITH DIFFERENTIAL      Result Value Range   WBC 11.5 (*) 4.0 - 10.5 K/uL   RBC 4.02  3.87 - 5.11 MIL/uL   Hemoglobin 13.1  12.0 - 15.0 g/dL   HCT 57.8  46.9 - 62.9 %   MCV 95.8  78.0 - 100.0 fL   MCH 32.6  26.0 - 34.0 pg   MCHC 34.0  30.0 - 36.0 g/dL   RDW 52.8  41.3 - 24.4 %   Platelets 281  150 - 400 K/uL   Neutrophils Relative % 43  43 - 77 %   Neutro Abs 4.9  1.7 - 7.7 K/uL     Lymphocytes Relative 48 (*) 12 - 46 %  Lymphs Abs 5.5 (*) 0.7 - 4.0 K/uL   Monocytes Relative 7  3 - 12 %   Monocytes Absolute 0.7  0.1 - 1.0 K/uL   Eosinophils Relative 2  0 - 5 %   Eosinophils Absolute 0.3  0.0 - 0.7 K/uL   Basophils Relative 0  0 - 1 %   Basophils Absolute 0.0  0.0 - 0.1 K/uL   Smear Review Criteria for review not met    POCT URINALYSIS DIPSTICK      Result Value Range   Color, UA yellow     Clarity, UA clear     Glucose, UA neg     Bilirubin, UA neg     Ketones, UA neg     Spec Grav, UA 1.010     Blood, UA trace     pH, UA 6.5     Protein, UA neg     Urobilinogen, UA 0.2     Nitrite, UA neg     Leukocytes, UA Negative    POCT WET PREP WITH KOH      Result Value Range   Trichomonas, UA Negative     Clue Cells Wet Prep HPF POC 2-5     Epithelial Wet Prep HPF POC 4-10     Yeast Wet Prep HPF POC neg     Bacteria Wet Prep HPF POC 3+     RBC Wet Prep HPF POC neg     WBC Wet Prep HPF POC 1-4     KOH Prep POC Negative          Assessment & Plan:  Routine general medical examination at a health care facility - screening labs.  She is UTD on her vaccinations and will plan on getting the flu vaccine through work.  Plan: POCT urinalysis dipstick, Comprehensive metabolic panel, Lipid panel, TSH, CBC with Differential  Vaginal pain - Pt is experiencing vaginismus and vagintits - it is possible she may have BV that is causing the burning pain but due to the length of time she has had the pain she definitely has resulting vaginismus.  We discussed at length that we will treat her BV but she will need to retrain her vaginal muscles to help prevent that pain.  Due to the lesions that were seen on her exam she needs a biopsy of the lesion.  Plan: POCT Wet Prep with KOH, metroNIDAZOLE (FLAGYL) 500 MG tablet, Ambulatory referral to Gynecology, ALPRAZolam (XANAX) 0.25 MG tablet, lidocaine (XYLOCAINE) 2 % jelly -- will give her lidocaine to see if that will give her some  pain relief.  Visit for screening mammogram - will schedule for patient - had her last one at the breast center.  Plan: MM Digital Screening  Sensation of pressure in bladder area -  When she sees the GYN we will hopefully be able to get an pelvic US and we will send a urine culture today.  Plan: Urine culture  Thoracic back pain - Pt has failed PT and conservative therapy and due to her h/o mild scoliosis will refer to ortho - due to the fact that flexeril helps I think this is still musculoskeletal but without relief from PT a referral is warranted.  Plan: meloxicam (MOBIC) 15 MG tablet  Back pain - Plan: Ambulatory referral to Orthopedic Surgery, HYDROcodone-acetaminophen (NORCO/VICODIN) 5-325 MG per tablet, meloxicam (MOBIC) 15 MG tablet  Vaginal lesion - Plan: Ambulatory referral to Gynecology  Anxiety state, unspecified - Pt has untreated anxiety and has  had for a while but with her mom's recent medical concerns, vaginal pain and back pain and need to quit smoking. Plan: buPROPion (WELLBUTRIN XL) 150 MG 24 hr tablet -- will recheck in 1 month. -- Pt was given Xanax for her GYn appt and [place] so they are able to do a good exam.  Smoker - due to wanting to be on OCP -- and needing to stop for her mom will start wellbutrin and pt can use nicotine gum or patches and d/w pt how to use   Contraception planning - I am unable to refill her OCP due to her smoking - if she is able to quit we can definitely discuss this.  Pt understands and agrees with the above plan.  [name] PA-C 07/30/2013 5:17 PM

## 2013-07-30 NOTE — Telephone Encounter (Signed)
Please advise. Patient does not know what cream. States Maralyn Sago will know

## 2013-07-30 NOTE — Telephone Encounter (Signed)
Patient states that she is to pick up some type of cream from our office. Patient sees [name] and states that she will know what the cream is for. [phone]

## 2013-07-31 LAB — URINE CULTURE: Organism ID, Bacteria: NO GROWTH

## 2013-07-31 NOTE — Progress Notes (Signed)
One month recheck scheduled for 10/15, pt notified via voicemail.

## 2013-07-31 NOTE — Telephone Encounter (Signed)
Sounds good, hope it helps. Called her to advise.

## 2013-08-06 ENCOUNTER — Ambulatory Visit (INDEPENDENT_AMBULATORY_CARE_PROVIDER_SITE_OTHER): Payer: 59 | Admitting: Gynecology

## 2013-08-06 ENCOUNTER — Encounter: Payer: Self-pay | Admitting: Gynecology

## 2013-08-06 VITALS — BP 110/74 | Ht 65.0 in | Wt 126.0 lb

## 2013-08-06 DIAGNOSIS — N898 Other specified noninflammatory disorders of vagina: Secondary | ICD-10-CM

## 2013-08-06 DIAGNOSIS — IMO0002 Reserved for concepts with insufficient information to code with codable children: Secondary | ICD-10-CM

## 2013-08-06 LAB — WET PREP FOR TRICH, YEAST, CLUE
Clue Cells Wet Prep HPF POC: NONE SEEN
Trich, Wet Prep: NONE SEEN
WBC, Wet Prep HPF POC: NONE SEEN
Yeast Wet Prep HPF POC: NONE SEEN

## 2013-08-06 MED ORDER — TERCONAZOLE 0.4 % VA CREA: 1.0000 | TOPICAL_CREAM | Freq: Every day | VAGINAL | Status: DC

## 2013-08-06 MED ORDER — DIAZEPAM 5 MG PO TABS: 5.0000 mg | ORAL_TABLET | Freq: Four times a day (QID) | ORAL | Status: DC | PRN

## 2013-08-06 MED ORDER — FLUCONAZOLE 200 MG PO TABS: 200.0000 mg | ORAL_TABLET | Freq: Every day | ORAL | Status: DC

## 2013-08-06 NOTE — Progress Notes (Signed)
Jennifer French 05-20-1971 [id number]        42 y.o.  G0P0 presents in consultation from [name] in reference to painful intercourse. Patient notes normal sexual activity with her husband up until approximately 8 months ago when she had the gradual onset of vaginal discomfort with penetration it has progressed now that she can no longer have intercourse. She has tremendous pain when anything is inserted into the opening of her vagina. Denies any precipitating activity or events. No traumatic history or sexual assault history. No vaginal discharge, odor or lesions noted on self-exam.  On Jethro Poling exam she noted some white plaque areas around the opening of the introitus. She also noticed significant tenderness around this area. She discussed with patient possibility of vaginismus and have prescribed her some Xanax and lidocaine 2% jelly. Patient has not used this yet. She did take a Xanax before her visit today. Been on birth control pills previously but these were discontinued recently due to her smoking history. Maralyn Sago performed a complete exam including a breast exam. Her lab tests were normal to include a normal urinalysis. Pap smear 2013 normal.  Past medical history,surgical history, medications, allergies, family history and social history were all reviewed and documented in the EPIC chart.  ROS:  Performed and pertinent positives and negatives are included in the history, assessment and plan .  Exam: Directed towards her complaint   Kim assistant Filed Vitals:   08/06/13 0922  BP: 110/74  Height: 5\' 5"  (1.651 m)  Weight: 126 lb (57.153 kg)   General appearance  Normal Skin grossly normal Abdominal  soft, nontender, without masses, organomegaly or hernia Pelvic  Ext/BUS/vagina   cakey white discharge. Significant introital pain with speculum or digital insertion. Q-tip testing showed tenderness around the hymenal ring 360, more intense posteriorly  Cervix  normal  Uterus    anteverted, normal size, shape and contour, midline and mobile nontender   Adnexa  Without masses or tenderness    Anus and perineum  normal   Rectovaginal  normal sphincter tone without palpated masses or tenderness.    Assessment/Plan:  42 y.o. G0P0 female  new onset worsening dyspareunia with strong vaginismus symptomatology. No overt precipitating event. Thick cakey discharge suggestive of yeast. Wet prep was negative but clinically appears yeast. I did not see the white plaque like lesions described by Maralyn Sago and I suspect that these were the cakey discharge that gave the appearance. Question whether yeast may have triggered the vaginismus which has cascaded to its present state. No other overt precipitating events such as trauma, assault or psychological changes. Will aggressively treat with Diflucan 200 mg x5 days and Terazol 7 cream vaginally. Discussed vaginismus treatment to include Valium 5 mg pre-vaginal insertion #30 prescribed, lidocaine jelly around the introital opening and then self vaginal dilatation starting small and slowly over weeks time progressing to larger dilators. The need to do this alone without her husband stressed. Once she achieves comfort with this then to involve him in which she controls the situation for insertion and recognizing climax is not the goal but just to allow for insertion comfortably initially. Ultimately progressing to a normal intercourse.  I did discuss other pathological contributors to painful intercourse to include interstitial cystitis, endometriosis, vaginal lesions, HPV, none of which she has overt symptoms or findings on exam. We'll hold on further evaluation to include ultrasound or urology referral until the above attempted treatment. Will also hold on more aggressive vaginismus treatments such as injection or excisional.  Note: This document was prepared with digital dictation and possible smart phrase technology. Any transcriptional errors  that result from this process are unintentional.   Dara Lords MD, 10:25 AM 08/06/2013

## 2013-08-06 NOTE — Patient Instructions (Addendum)
Do as we discussed during your office visit. Call me or followup if the situation continues or you have any questions at all.     Dyspareunia Dyspareunia is pain during sexual intercourse. It is most common in women, but it also happens in men.  CAUSES  Female The pain from this condition is usually felt when anything is put into the vagina, but any part of the genitals may cause pain during sex. Even sitting or wearing pants can cause pain. Sometimes, a cause cannot be found. Some causes of pain during intercourse are:  Infections of the skin around the vagina.  Vaginal infections, such as a yeast, bacterial, or viral infection.  Vaginismus. This is the inability to have anything put in the vagina even when the woman wants it to happen. There is an automatic muscle contraction and pain. The pain of the muscle contraction can be so severe that intercourse is impossible.  Allergic reaction from spermicides, semen, condoms, scented tampons, soaps, douches, and vaginal sprays.  A fluid-filled sac (cyst) on the Bartholin or Skene glands, located at the opening of the vagina.  Scar tissue in the vagina from a surgically enlarged opening (episiotomy) or tearing after delivering a baby.  Vaginal dryness. This is more common in menopause. The normal secretions of the vagina are decreased. Changes in estrogen levels and increased difficulty becoming aroused can cause painful sex. Vaginal dryness can also happen when taking birth control pills.  Thinning of the tissue (atrophy) of the vulva and vagina. This makes the area thinner, smaller, unable to stretch to accommodate a penis, and prone to infection and tearing.  Vulvar vestibulitis or vestibulodynia.This is a condition that causes pain involving the area around the entrance to the vagina.The most common cause in young women is birth control pills.Women with low estrogen levels (postmenopausal women) may also experience this.Other causes  include allergic reactions, too many nerve endings, skin conditions, and pelvic muscles that cannot relax.  Vulvar dermatoses. This includes skin conditions such as lichen sclerosus and lichen planus.  Lack of foreplay to lubricate the vagina. This can cause vaginal dryness.  Noncancerous tumors (fibroids) in the uterus.  Uterus lining tissue growing outside the uterus (endometriosis).  Pregnancy that starts in the fallopian tube (tubal pregnancy).  Pregnancy or breastfeeding your baby. This can cause vaginal dryness.  A tilting or prolapse of the uterus. Prolapse is when weak and stretched muscles around the uterus allow it to fall into the vagina.  Problems with the ovaries, cysts, or scar tissue. This may be worse with certain sexual positions.  Previous surgeries causing adhesions or scar tissue in the vagina or pelvis.  Bladder and intestinal problems.  Psychological problems (such as depression or anxiety). This may make pain worse.  Negative attitudes about sex, experiencing rape, sexual assault, and misinformation about sex. These issues are often related to some types of pain.  Previous pelvic infection, causing scar tissue in the pelvis and on the female organs.  Cyst or tumor on the ovary.  Cancer of the female organs.  Certain medicines.  Medical problems such as diabetes, arthritis, or thyroid disease. Female In men, there are many physical causes of sexual discomfort. Some causes of pain during intercourse are:  Infections of the prostate, bladder, or seminal vesicles. This can cause pain after ejaculation.  An inflamed bladder (interstitial cystitis). This may cause pain from ejaculation.  Gonorrheal infections. This may cause pain during ejaculation.  An inflamed urethra (urethritis) or inflamed prostate (prostatitis).  This can make genital stimulation painful or uncomfortable.  Deformities of the penis, such as Peyronie's disease.  A tight  foreskin.  Cancer of the female organs.  Psychological problems. This may make pain worse. DIAGNOSIS   Your caregiver will take a history and have you describe where the pain is located (outside the vagina, in the vagina, in the pelvis). You may be asked when you experience pain, such as with penetration or with thrusting.  Following this, your caregiver will do a physical exam. Let your caregiver know if the exam is too painful.  During the final part of the female exam, your caregiver will feel your uterus and ovaries with one hand on the abdomen and one finger in your vagina. This is a pelvic exam.  Blood tests, a Pap test, cultures for infection, an ultrasound test, and X-rays may be done. You may need to see a specialist for female problems (gynecologist).  Your caregiver may do a CT scan, MRI, or laparoscopy. Laparoscopy is a procedure to look into the pelvis with a lighted tube, through a cut (incision) in the abdomen. TREATMENT  Your caregiver can help you determine the best course of treatment. Sometimes, more testing is done. Continue with the suggested testing until your caregiver feels sure about your diagnosis and how to treat it. Sometimes, it is difficult to find the reason for the pain. The search for the cause and treatment can be frustrating. Treatment often takes several weeks to a few months before you notice any improvement. You may also need to avoid sexual activity until symptoms improve.Continuing to have sex when it hurts can delay healing and actually make the problem worse. The treatment depends on the cause of the pain. Treatment may include:  Medicines such as antibiotics, vaginal or skin creams, hormones, or antidepressants.  Minor or major surgery.  Psychological counseling or group therapy.  Kegel exercises and vaginal dilators to help certain cases of vaginismus (spasms). Do this only if recommended by your caregiver.Kegel exercises can make some problems  worse.  Applying lubrication as recommended by your caregiver if you have dryness.  Sex therapy for you and your sex partner. It is common for the pain to continue after the reason for the pain has been treated. Some reasons for this include a conditioned response. This means the person having the pain becomes so familiar with the pain that the pain continues as a response, even though the cause is removed. Sex therapy can help with this problem. HOME CARE INSTRUCTIONS   Follow your caregiver's instructions about taking medicines, tests, counseling, and follow-up treatment.  Do not use scented tampons, douches, vaginal sprays, or soaps.  Use water-based lubricants for dryness. Oil lubricants can cause irritation.  Do not use spermicides or condoms that irritate you.  Openly discuss with your partner your sexual experience, your desires, foreplay, and different sexual positions for a more comfortable and enjoyable sexual relationship.  Join group sessions for therapy, if needed.  Practice safe sex at all times.  Empty your bladder before having intercourse.  Try different positions during sexual intercourse.  Take over-the-counter pain medicine recommended by your caregiver before having sexual intercourse.  Do not wear pantyhose. Knee-high and thigh-high hose are okay.  Avoid scrubbing your vulva with a washcloth. Wash the area gently and pat dry with a towel. SEEK MEDICAL CARE IF:   You develop vaginal bleeding after sexual intercourse.  You develop a lump at the opening of your vagina, even if  it is not painful.  You have abnormal vaginal discharge.  You have vaginal dryness.  You have itching or irritation of the vulva or vagina.  You develop a rash or reaction to your medicine. SEEK IMMEDIATE MEDICAL CARE IF:   You develop severe abdominal pain during or shortly after sexual intercourse. You could have a ruptured ovarian cyst or ruptured tubal pregnancy.  You have  a fever.  You have painful or bloody urination.  You have painful sexual intercourse, and you never had it before.  You pass out after having sexual intercourse. Document Released: 11/18/2007 Document Revised: 01/21/2012 Document Reviewed: 01/29/2011 [facility] Patient Information 2014 [place], [place].

## 2013-08-06 NOTE — Addendum Note (Signed)
Addended by: Dayna Barker on: 08/06/2013 11:22 AM   Modules accepted: Orders

## 2013-08-07 LAB — URINALYSIS W MICROSCOPIC + REFLEX CULTURE
Bilirubin Urine: NEGATIVE
Glucose, UA: NEGATIVE mg/dL
Hgb urine dipstick: NEGATIVE
Leukocytes, UA: NEGATIVE
Nitrite: NEGATIVE
Protein, ur: NEGATIVE mg/dL
Squamous Epithelial / LPF: NONE SEEN
Urobilinogen, UA: 0.2 mg/dL (ref 0.0–1.0)

## 2013-08-18 ENCOUNTER — Ambulatory Visit
Admission: RE | Admit: 2013-08-18 | Discharge: 2013-08-18 | Disposition: A | Discharge status: Home / Self Care | Payer: 59 | Source: Ambulatory Visit | Attending: Physician Assistant | Admitting: Physician Assistant

## 2013-08-18 DIAGNOSIS — Z1231 Encounter for screening mammogram for malignant neoplasm of breast: Secondary | ICD-10-CM

## 2013-08-20 ENCOUNTER — Encounter: Payer: Self-pay | Admitting: Physician Assistant

## 2013-08-20 DIAGNOSIS — M549 Dorsalgia, unspecified: Secondary | ICD-10-CM

## 2013-08-20 DIAGNOSIS — G8929 Other chronic pain: Secondary | ICD-10-CM | POA: Insufficient documentation

## 2013-08-26 ENCOUNTER — Encounter: Payer: Self-pay | Admitting: Physician Assistant

## 2013-08-26 ENCOUNTER — Ambulatory Visit (INDEPENDENT_AMBULATORY_CARE_PROVIDER_SITE_OTHER): Payer: 59 | Admitting: Physician Assistant

## 2013-08-26 VITALS — BP 119/75 | HR 87 | Temp 98.7°F | Resp 16 | Ht 66.0 in | Wt 130.0 lb

## 2013-08-26 DIAGNOSIS — N94819 Vulvodynia, unspecified: Secondary | ICD-10-CM

## 2013-08-26 DIAGNOSIS — F411 Generalized anxiety disorder: Secondary | ICD-10-CM

## 2013-08-26 DIAGNOSIS — M549 Dorsalgia, unspecified: Secondary | ICD-10-CM

## 2013-08-26 DIAGNOSIS — IMO0002 Reserved for concepts with insufficient information to code with codable children: Secondary | ICD-10-CM

## 2013-08-26 LAB — POCT WET PREP WITH KOH
KOH Prep POC: NEGATIVE
Trichomonas, UA: NEGATIVE

## 2013-08-26 MED ORDER — BUPROPION HCL ER (XL) 300 MG PO TB24: 300.0000 mg | ORAL_TABLET | Freq: Every day | ORAL | Status: DC

## 2013-08-26 MED ORDER — GABAPENTIN 100 MG PO CAPS: 100.0000 mg | ORAL_CAPSULE | Freq: Every day | ORAL | Status: DC

## 2013-08-26 MED ORDER — HYDROCODONE-ACETAMINOPHEN 5-325 MG PO TABS: 1.0000 | ORAL_TABLET | Freq: Every day | ORAL | Status: DC | PRN

## 2013-08-26 MED ORDER — CYCLOBENZAPRINE HCL 5 MG PO TABS: 5.0000 mg | ORAL_TABLET | Freq: Three times a day (TID) | ORAL | Status: DC | PRN

## 2013-08-26 NOTE — Patient Instructions (Addendum)
Vulvodynia  Start with Gabapentin 100mg  - for 1 week - 1 at bedtime  For week 2 - 1 bid (bedtime and morning) For week 3- 1 tid -- then call me with results  Dyspareunia Dyspareunia is pain during sexual intercourse. It is most common in women, but it also happens in men.  CAUSES  Female The pain from this condition is usually felt when anything is put into the vagina, but any part of the genitals may cause pain during sex. Even sitting or wearing pants can cause pain. Sometimes, a cause cannot be found. Some causes of pain during intercourse are:  Infections of the skin around the vagina.  Vaginal infections, such as a yeast, bacterial, or viral infection.  Vaginismus. This is the inability to have anything put in the vagina even when the woman wants it to happen. There is an automatic muscle contraction and pain. The pain of the muscle contraction can be so severe that intercourse is impossible.  Allergic reaction from spermicides, semen, condoms, scented tampons, soaps, douches, and vaginal sprays.  A fluid-filled sac (cyst) on the Bartholin or Skene glands, located at the opening of the vagina.  Scar tissue in the vagina from a surgically enlarged opening (episiotomy) or tearing after delivering a baby.  Vaginal dryness. This is more common in menopause. The normal secretions of the vagina are decreased. Changes in estrogen levels and increased difficulty becoming aroused can cause painful sex. Vaginal dryness can also happen when taking birth control pills.  Thinning of the tissue (atrophy) of the vulva and vagina. This makes the area thinner, smaller, unable to stretch to accommodate a penis, and prone to infection and tearing.  Vulvar vestibulitis or vestibulodynia.This is a condition that causes pain involving the area around the entrance to the vagina.The most common cause in young women is birth control pills.Women with low estrogen levels (postmenopausal women) may also  experience this.Other causes include allergic reactions, too many nerve endings, skin conditions, and pelvic muscles that cannot relax.  Vulvar dermatoses. This includes skin conditions such as lichen sclerosus and lichen planus.  Lack of foreplay to lubricate the vagina. This can cause vaginal dryness.  Noncancerous tumors (fibroids) in the uterus.  Uterus lining tissue growing outside the uterus (endometriosis).  Pregnancy that starts in the fallopian tube (tubal pregnancy).  Pregnancy or breastfeeding your baby. This can cause vaginal dryness.  A tilting or prolapse of the uterus. Prolapse is when weak and stretched muscles around the uterus allow it to fall into the vagina.  Problems with the ovaries, cysts, or scar tissue. This may be worse with certain sexual positions.  Previous surgeries causing adhesions or scar tissue in the vagina or pelvis.  Bladder and intestinal problems.  Psychological problems (such as depression or anxiety). This may make pain worse.  Negative attitudes about sex, experiencing rape, sexual assault, and misinformation about sex. These issues are often related to some types of pain.  Previous pelvic infection, causing scar tissue in the pelvis and on the female organs.  Cyst or tumor on the ovary.  Cancer of the female organs.  Certain medicines.  Medical problems such as diabetes, arthritis, or thyroid disease. Female In men, there are many physical causes of sexual discomfort. Some causes of pain during intercourse are:  Infections of the prostate, bladder, or seminal vesicles. This can cause pain after ejaculation.  An inflamed bladder (interstitial cystitis). This may cause pain from ejaculation.  Gonorrheal infections. This may cause pain during ejaculation.  An inflamed urethra (urethritis) or inflamed prostate (prostatitis). This can make genital stimulation painful or uncomfortable.  Deformities of the penis, such as Peyronie's  disease.  A tight foreskin.  Cancer of the female organs.  Psychological problems. This may make pain worse. DIAGNOSIS   Your caregiver will take a history and have you describe where the pain is located (outside the vagina, in the vagina, in the pelvis). You may be asked when you experience pain, such as with penetration or with thrusting.  Following this, your caregiver will do a physical exam. Let your caregiver know if the exam is too painful.  During the final part of the female exam, your caregiver will feel your uterus and ovaries with one hand on the abdomen and one finger in your vagina. This is a pelvic exam.  Blood tests, a Pap test, cultures for infection, an ultrasound test, and X-rays may be done. You may need to see a specialist for female problems (gynecologist).  Your caregiver may do a CT scan, MRI, or laparoscopy. Laparoscopy is a procedure to look into the pelvis with a lighted tube, through a cut (incision) in the abdomen. TREATMENT  Your caregiver can help you determine the best course of treatment. Sometimes, more testing is done. Continue with the suggested testing until your caregiver feels sure about your diagnosis and how to treat it. Sometimes, it is difficult to find the reason for the pain. The search for the cause and treatment can be frustrating. Treatment often takes several weeks to a few months before you notice any improvement. You may also need to avoid sexual activity until symptoms improve.Continuing to have sex when it hurts can delay healing and actually make the problem worse. The treatment depends on the cause of the pain. Treatment may include:  Medicines such as antibiotics, vaginal or skin creams, hormones, or antidepressants.  Minor or major surgery.  Psychological counseling or group therapy.  Kegel exercises and vaginal dilators to help certain cases of vaginismus (spasms). Do this only if recommended by your caregiver.Kegel exercises can  make some problems worse.  Applying lubrication as recommended by your caregiver if you have dryness.  Sex therapy for you and your sex partner. It is common for the pain to continue after the reason for the pain has been treated. Some reasons for this include a conditioned response. This means the person having the pain becomes so familiar with the pain that the pain continues as a response, even though the cause is removed. Sex therapy can help with this problem. HOME CARE INSTRUCTIONS   Follow your caregiver's instructions about taking medicines, tests, counseling, and follow-up treatment.  Do not use scented tampons, douches, vaginal sprays, or soaps.  Use water-based lubricants for dryness. Oil lubricants can cause irritation.  Do not use spermicides or condoms that irritate you.  Openly discuss with your partner your sexual experience, your desires, foreplay, and different sexual positions for a more comfortable and enjoyable sexual relationship.  Join group sessions for therapy, if needed.  Practice safe sex at all times.  Empty your bladder before having intercourse.  Try different positions during sexual intercourse.  Take over-the-counter pain medicine recommended by your caregiver before having sexual intercourse.  Do not wear pantyhose. Knee-high and thigh-high hose are okay.  Avoid scrubbing your vulva with a washcloth. Wash the area gently and pat dry with a towel. SEEK MEDICAL CARE IF:   You develop vaginal bleeding after sexual intercourse.  You develop a lump  at the opening of your vagina, even if it is not painful.  You have abnormal vaginal discharge.  You have vaginal dryness.  You have itching or irritation of the vulva or vagina.  You develop a rash or reaction to your medicine. SEEK IMMEDIATE MEDICAL CARE IF:   You develop severe abdominal pain during or shortly after sexual intercourse. You could have a ruptured ovarian cyst or ruptured tubal  pregnancy.  You have a fever.  You have painful or bloody urination.  You have painful sexual intercourse, and you never had it before.  You pass out after having sexual intercourse. Document Released: 11/18/2007 Document Revised: 01/21/2012 Document Reviewed: 01/29/2011 [facility] Patient Information 2014 [place], [place].

## 2013-08-28 NOTE — Progress Notes (Signed)
Left message for patient regarding scheduling her 2 month recheck with [name].

## 2013-09-01 DIAGNOSIS — N94819 Vulvodynia, unspecified: Secondary | ICD-10-CM | POA: Insufficient documentation

## 2013-09-01 NOTE — Progress Notes (Signed)
[address] [place] [address]   Phone [phone]  Subjective:    Patient ID: Jennifer French, female    DOB: 01/11/1971, 42 y.o.   MRN: [id number]  HPI Pt present to clinic for recheck. 1- Anxiety - is better on the Wellbutrin - she is taking 300mg  daily.  She has less interest in smoking but she is still doing it for stress release.  She tried the Xanax and did not like the way it made her feel. 2- saw the back specialist she is setting her up with a TENS unit but discussed with her that this may be her new normal due to degenerative changes in her back.  He put her on Valium and it made her relaxed but she is concerned about the long term addictiveness of the drug.  She needs refills of her pain med (she understands they are also addictive over time) she recently moved and all her meds are gone. 3- She is still having significant vulvodynia and dyspareunia.  She has attempted to have sex with her husband but has not used the lidocaine jelly.  She has not really tried manual dilation of her vaginal opening.  She wants me to do another exam because the GYN did not see the white plaques that were seen here at her last visit.   Review of Systems     Objective:   Physical Exam  Vitals reviewed. Constitutional: She is oriented to person, place, and time. She appears well-developed and well-nourished.  Cardiovascular: Normal rate, regular rhythm and normal heart sounds.   No murmur heard. Pulmonary/Chest: Effort normal and breath sounds normal.  Genitourinary: There is no rash, tenderness, lesion or injury on the right labia. There is no rash, tenderness, lesion or injury on the left labia. There is tenderness (significant TTP - cotton testing causes pain - worse in the upper vagina area between 9 o'clock and 3 o'clock, no white plaques are present at this exam) around the vagina.  Difficult exam due to pain.  Pt is much better at today's visit.  Pt has significant vaginismus on exam  that is reduced with slow digital manipulation.  Neurological: She is alert and oriented to person, place, and time.  Skin: Skin is warm and dry.  Psychiatric: She has a normal mood and affect. Her behavior is normal. Judgment and thought content normal.   Results for orders placed in visit on 08/26/13  POCT WET PREP WITH KOH      Result Value Range   Trichomonas, UA Negative     Clue Cells Wet Prep HPF POC 6-18     Epithelial Wet Prep HPF POC 21-32     Yeast Wet Prep HPF POC neg     Bacteria Wet Prep HPF POC 4+     RBC Wet Prep HPF POC 0-3     WBC Wet Prep HPF POC 0-4     KOH Prep POC Negative         Assessment & Plan:  Dyspareunia - Plan: POCT Wet Prep with KOH -   Anxiety state, unspecified - Plan: buPROPion (WELLBUTRIN XL) 300 MG 24 hr tablet - continue medication - recheck in 2-3 months.  Back pain - Meds refilled - Plan: cyclobenzaprine (FLEXERIL) 5 MG tablet, HYDROcodone-acetaminophen (NORCO/VICODIN) 5-325 MG per tablet  Vulvodynia - D/w pt at length - treatment options and plans - we discussed possible pelvic therapy but pt will try manual dilation at home with her husband to decrease her  psychological response to penetration.  We will start Neurontin (and increase to pain control or 300mg  tid) to see if we can treat the nerve pain of her situation.  She will use the lidocaine before sex to help with her pain and to not increase her vaginismus.  Plan: gabapentin (NEURONTIN) 100 MG capsule  She understands the current plan and will f/u with me in 2-3 months.  [name] PA-C 09/01/2013 8:18 AM

## 2013-09-22 ENCOUNTER — Telehealth: Payer: Self-pay

## 2013-09-22 ENCOUNTER — Other Ambulatory Visit: Payer: Self-pay | Admitting: Physician Assistant

## 2013-09-22 DIAGNOSIS — M549 Dorsalgia, unspecified: Secondary | ICD-10-CM

## 2013-09-22 MED ORDER — HYDROCODONE-ACETAMINOPHEN 5-325 MG PO TABS: 1.0000 | ORAL_TABLET | Freq: Every day | ORAL | Status: DC | PRN

## 2013-09-22 NOTE — Telephone Encounter (Signed)
Pt is called to request a refill from [name] for hydrocodone Please call pt to advise

## 2013-09-22 NOTE — Telephone Encounter (Signed)
Ready

## 2013-09-22 NOTE — Telephone Encounter (Signed)
Please advise 

## 2013-09-22 NOTE — Telephone Encounter (Signed)
Patient advised ready for pick up

## 2013-09-25 ENCOUNTER — Other Ambulatory Visit: Payer: Self-pay | Admitting: Physician Assistant

## 2013-10-20 ENCOUNTER — Telehealth: Payer: Self-pay

## 2013-10-20 DIAGNOSIS — M549 Dorsalgia, unspecified: Secondary | ICD-10-CM

## 2013-10-20 NOTE — Telephone Encounter (Signed)
Patient requesting refill on Hydrocodone  [phone]

## 2013-10-20 NOTE — Telephone Encounter (Signed)
Please advise. Pended (was given in Oct for lumbago)

## 2013-10-21 ENCOUNTER — Other Ambulatory Visit: Payer: Self-pay | Admitting: Physician Assistant

## 2013-10-21 MED ORDER — HYDROCODONE-ACETAMINOPHEN 5-325 MG PO TABS: 1.0000 | ORAL_TABLET | Freq: Every day | ORAL | Status: DC | PRN

## 2013-10-21 NOTE — Telephone Encounter (Signed)
Ready to be picked up

## 2013-10-22 NOTE — Telephone Encounter (Signed)
Signed.

## 2013-10-22 NOTE — Telephone Encounter (Signed)
Patient advised.

## 2013-10-22 NOTE — Telephone Encounter (Signed)
Will you sign this?

## 2013-10-26 ENCOUNTER — Other Ambulatory Visit: Payer: Self-pay | Admitting: Physician Assistant

## 2013-11-10 ENCOUNTER — Ambulatory Visit (INDEPENDENT_AMBULATORY_CARE_PROVIDER_SITE_OTHER): Payer: 59 | Admitting: Family Medicine

## 2013-11-10 VITALS — BP 112/62 | HR 92 | Temp 98.9°F | Resp 18 | Wt 131.0 lb

## 2013-11-10 DIAGNOSIS — H9201 Otalgia, right ear: Secondary | ICD-10-CM

## 2013-11-10 DIAGNOSIS — H6121 Impacted cerumen, right ear: Secondary | ICD-10-CM

## 2013-11-10 DIAGNOSIS — H6120 Impacted cerumen, unspecified ear: Secondary | ICD-10-CM

## 2013-11-10 DIAGNOSIS — H9209 Otalgia, unspecified ear: Secondary | ICD-10-CM

## 2013-11-10 NOTE — Progress Notes (Signed)
Subjective: Patient is here complaining of right ear pain. Her coworkers tried to get as much wax out as they could, but they're still wax in her ear. She has not had any cold or congestion.  Objective: TMs normal on the left. Full wax on the right. A large plug of wax was irrigated. TM is normal and ear canal looks normal  Assessment: Cerumen impaction right ear Otalgia  Plan: Advised periodic irrigation of years to keep them from building up.

## 2013-11-10 NOTE — Patient Instructions (Signed)
On a scheduled basis every 3-6 months use Debrox in the ears and irrigate them out.  Return if ear continues to hurt.

## 2013-11-17 ENCOUNTER — Telehealth: Payer: Self-pay

## 2013-11-17 DIAGNOSIS — M549 Dorsalgia, unspecified: Secondary | ICD-10-CM

## 2013-11-17 MED ORDER — HYDROCODONE-ACETAMINOPHEN 5-325 MG PO TABS: 1.0000 | ORAL_TABLET | Freq: Every day | ORAL | Status: DC | PRN

## 2013-11-17 NOTE — Telephone Encounter (Signed)
PT STATES SHE IS IN NEED OF HER HYDROCODONE, PLEASE CALL [phone] WHEN READY FOR PICK UP

## 2013-11-17 NOTE — Telephone Encounter (Signed)
Pt.notified

## 2013-11-17 NOTE — Telephone Encounter (Signed)
Ready

## 2013-11-17 NOTE — Telephone Encounter (Signed)
Pended please advise.  

## 2013-11-18 ENCOUNTER — Other Ambulatory Visit: Payer: Self-pay | Admitting: Physician Assistant

## 2013-11-26 ENCOUNTER — Other Ambulatory Visit: Payer: Self-pay | Admitting: Physician Assistant

## 2013-12-02 ENCOUNTER — Ambulatory Visit (INDEPENDENT_AMBULATORY_CARE_PROVIDER_SITE_OTHER): Payer: 59 | Admitting: Physician Assistant

## 2013-12-02 ENCOUNTER — Encounter: Payer: Self-pay | Admitting: Physician Assistant

## 2013-12-02 VITALS — BP 106/66 | HR 100 | Temp 98.4°F | Resp 16 | Ht 65.0 in | Wt 126.6 lb

## 2013-12-02 DIAGNOSIS — G43909 Migraine, unspecified, not intractable, without status migrainosus: Secondary | ICD-10-CM

## 2013-12-02 DIAGNOSIS — R102 Pelvic and perineal pain: Secondary | ICD-10-CM

## 2013-12-02 DIAGNOSIS — F411 Generalized anxiety disorder: Secondary | ICD-10-CM

## 2013-12-02 DIAGNOSIS — F419 Anxiety disorder, unspecified: Secondary | ICD-10-CM

## 2013-12-02 DIAGNOSIS — M549 Dorsalgia, unspecified: Secondary | ICD-10-CM

## 2013-12-02 DIAGNOSIS — N949 Unspecified condition associated with female genital organs and menstrual cycle: Secondary | ICD-10-CM

## 2013-12-02 DIAGNOSIS — M79609 Pain in unspecified limb: Secondary | ICD-10-CM

## 2013-12-02 MED ORDER — CYCLOBENZAPRINE HCL ER 15 MG PO CP24: 15.0000 mg | ORAL_CAPSULE | Freq: Every day | ORAL | Status: DC | PRN

## 2013-12-02 MED ORDER — BUPROPION HCL ER (XL) 300 MG PO TB24: 300.0000 mg | ORAL_TABLET | Freq: Every day | ORAL | Status: DC

## 2013-12-02 MED ORDER — HYDROCODONE-ACETAMINOPHEN 5-325 MG PO TABS: 1.0000 | ORAL_TABLET | Freq: Every day | ORAL | Status: DC | PRN

## 2013-12-02 MED ORDER — LIDOCAINE HCL 2 % EX GEL: CUTANEOUS | Status: DC | PRN

## 2013-12-02 MED ORDER — AMITRIPTYLINE HCL 10 MG PO TABS: 10.0000 mg | ORAL_TABLET | Freq: Every day | ORAL | Status: DC

## 2013-12-02 MED ORDER — NARATRIPTAN HCL 1 MG PO TABS: 1.0000 mg | ORAL_TABLET | Freq: Once | ORAL | Status: DC | PRN

## 2013-12-03 NOTE — Progress Notes (Signed)
   Subjective:    Patient ID: Laurence AlyHeather Schiele, female    DOB: 14-Oct-1971, 43 y.o.   MRN: [id number]  HPI Pt is very frustrated her vulvodynia seems to be worsening.  She has been forcing herself to have sex with her husband and it hurts so bad she is miserable but she feels like she has to do it for her marriage though her husband is very supportive she does not think that he really understands how much it hurts to have sex.  She has used an Public relations account executiveTC monsitat and she almost cannot tolerate inserting the applicator due to her pain.  She is now having the pain even when she is sitting.  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  The Neurontin did not help at all even when she increased the dose. The lidocaine jelly does help during sex but she has been out.  She has decreased her smoking and she finds that the Wellbutrin helps with her craving a cigarette.  She has increase anxiety but she really feels like it is related to her pain  Pt sees Dr Vela ProseLewitt for her migraines and she was wondering if I could start to fill her Amerge for her because she has not changed in years.  Review of Systems     Objective:   Physical Exam  Vitals reviewed. Constitutional: She is oriented to person, place, and time. She appears well-developed and well-nourished.  HENT:  Head: Normocephalic and atraumatic.  Right Ear: External ear normal.  Left Ear: External ear normal.  Pulmonary/Chest: Effort normal.  Neurological: She is alert and oriented to person, place, and time.  Skin: Skin is warm and dry.  Psychiatric:  Tearful during discussion of vulvodynia.       Assessment & Plan:  Vaginal pain - Pt has worsening vulvodynia and resulting vaginismus.  I am going to look into possible pelvic floor PT and/or a specialist in the area.  We  will try Elavil to see if that will help in the mean time.  Plan: lidocaine (XYLOCAINE) 2 % jelly, amitriptyline (ELAVIL) 10 MG tablet  Back pain - We will try Amrix instead of flexeril to see if she has improved back pain with 24h muscle spasm relief without daytime somnolence.  Plan: HYDROcodone-acetaminophen (NORCO/VICODIN) 5-325 MG per tablet, cyclobenzaprine (AMRIX) 15 MG 24 hr capsule  Anxiety - Plan: buPROPion (WELLBUTRIN XL) 300 MG 24 hr tablet  Migraine - We will refill her meds today but in the future if her headaches change she will go back to him for evaluation.  She will plan on seeing him every 3 years to stay a current patient in his office.  Plan: naratriptan (AMERGE) 1 MG TABS tablet  Benny LennertSarah Demyan Fugate [facility] 12/03/2013 2:49 PM

## 2013-12-17 ENCOUNTER — Other Ambulatory Visit: Payer: Self-pay | Admitting: Physician Assistant

## 2013-12-17 DIAGNOSIS — N94819 Vulvodynia, unspecified: Secondary | ICD-10-CM

## 2013-12-29 ENCOUNTER — Other Ambulatory Visit: Payer: Self-pay | Admitting: Physician Assistant

## 2013-12-30 ENCOUNTER — Telehealth: Payer: Self-pay

## 2013-12-30 DIAGNOSIS — M549 Dorsalgia, unspecified: Secondary | ICD-10-CM

## 2013-12-30 NOTE — Telephone Encounter (Signed)
Pt states that she would like a refill on hydrocodone, she is aware that she is not due to have this refilled but wanted to go ahead and request it. Also pt would like to change from amrix to flexeril due to the cost.

## 2013-12-31 MED ORDER — HYDROCODONE-ACETAMINOPHEN 5-325 MG PO TABS: 1.0000 | ORAL_TABLET | Freq: Every day | ORAL | Status: DC | PRN

## 2013-12-31 MED ORDER — CYCLOBENZAPRINE HCL 5 MG PO TABS: ORAL_TABLET | ORAL | Status: DC

## 2013-12-31 NOTE — Telephone Encounter (Signed)
Ready

## 2014-01-01 NOTE — Telephone Encounter (Signed)
Notified pt ready. 

## 2014-01-25 ENCOUNTER — Ambulatory Visit (INDEPENDENT_AMBULATORY_CARE_PROVIDER_SITE_OTHER): Payer: 59 | Admitting: Physician Assistant

## 2014-01-25 VITALS — BP 124/76 | HR 99 | Temp 98.0°F | Resp 18 | Ht 64.75 in | Wt 129.6 lb

## 2014-01-25 DIAGNOSIS — M549 Dorsalgia, unspecified: Secondary | ICD-10-CM

## 2014-01-25 DIAGNOSIS — G4700 Insomnia, unspecified: Secondary | ICD-10-CM

## 2014-01-25 DIAGNOSIS — N949 Unspecified condition associated with female genital organs and menstrual cycle: Secondary | ICD-10-CM

## 2014-01-25 DIAGNOSIS — R102 Pelvic and perineal pain: Secondary | ICD-10-CM

## 2014-01-25 MED ORDER — LIDOCAINE (ANORECTAL) 5 % EX GEL: 1.0000 "application " | Freq: Three times a day (TID) | CUTANEOUS | Status: DC

## 2014-01-25 MED ORDER — METAXALONE 800 MG PO TABS: 800.0000 mg | ORAL_TABLET | Freq: Three times a day (TID) | ORAL | Status: DC

## 2014-01-25 MED ORDER — HYDROCODONE-ACETAMINOPHEN 5-325 MG PO TABS: 1.0000 | ORAL_TABLET | Freq: Three times a day (TID) | ORAL | Status: DC

## 2014-01-25 MED ORDER — DESIPRAMINE HCL 10 MG PO TABS: 10.0000 mg | ORAL_TABLET | Freq: Every day | ORAL | Status: DC

## 2014-01-25 MED ORDER — TRAZODONE HCL 50 MG PO TABS: 50.0000 mg | ORAL_TABLET | Freq: Every evening | ORAL | Status: DC | PRN

## 2014-01-25 NOTE — Progress Notes (Signed)
   Subjective:    Patient ID: Jennifer French, female    DOB: 04/06/1971, 43 y.o.   MRN: [id number]  HPI Pt presents to clinic for recheck.  She has increased her use of her Norco because her vulvodynia has gotten significantly worse.  She is having pain all the time, she is using the lidocaine and it seems to be the only thing that is helping her.  Her underwear touching her makes her have pain all the time and sitting and walking makes it worse.  The only time she is pain free if when she is maked sitting with her legs opened with nothing touchin her external genitalia. She is tolerating the Elavil but her pain does not seem improved.  She is very frustrated.  She went to [facility] and met with Suanne Marker and was really happy with the visit but her next several visits are with multiple other people and that makes her really uncomfortable.  She was given a biofeedback unit but was told she could not use lidocaine jelly and she has tried it at home but the pain is to bad to do it regularly.  She had a really painful weekend and now she is really frustrated and anxious about her situation.  She used some of her moms Trazodone and it helped her sleep - which she is having trouble doing because of the pain.  She is currently on her menses and she definitely feels like her menses makes the pain worse.  She feels like there is a lesion in her vagina but she does not want me to look today because her flow is really heavy.  Back pain - does not seem to be bothering her like it used to but she thinks that her vaginal pain is so bad that she is not aware of her back pain like she used to.  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    Review of Systems     Objective:   Physical Exam  Vitals reviewed. Constitutional: She is oriented to person, place, and time. She appears well-developed and well-nourished.  HENT:  Head: Normocephalic  and atraumatic.  Right Ear: External ear normal.  Left Ear: External ear normal.  Pulmonary/Chest: Effort normal.  Neurological: She is alert and oriented to person, place, and time.  Skin: Skin is warm and dry.  Psychiatric: She has a normal mood and affect. Her behavior is normal. Judgment and thought content normal.  tearful       Assessment & Plan:  Back pain - Flexeril does not seem to be working so we will try Skelaxin to see if that will help her muscle spasms.  Plan: metaxalone (SKELAXIN) 800 MG tablet, HYDROcodone-acetaminophen (NORCO/VICODIN) 5-325 MG per tablet  Vaginal pain - Pt has significant vulvodynia and she wants to try integrative therapy for pelvic floor therapy -- we will change Elavil to desipramine because it has less side effects and can be titrated higher - we will start at $Remove'10mg'Hxyxwnl$  and increase by $RemoveBef'10mg'ubQsrCnyto$  every 5 daya until she reaches $RemoveBef'50mg'HrfOQoPgYS$  and then the total of 100-$RemoveBefo'150mg'biJBQPFeKli$  -- Plan: desipramine (NOPRAMIN) 10 MG tablet, Lidocaine, Anorectal, 5 % GEL  Insomnia - We will start this medication - Plan: traZODone (DESYREL) 50 MG tablet  Recheck in 1 month --   Windell Hummingbird PA-C  [facility] 01/25/2014 1:33 PM

## 2014-01-25 NOTE — Patient Instructions (Signed)
Desipramine-  Start with 10mg  at bedtime - increase by 1 pill every 5 days until you reach 50mg 

## 2014-02-12 ENCOUNTER — Telehealth: Payer: Self-pay

## 2014-02-12 DIAGNOSIS — M549 Dorsalgia, unspecified: Secondary | ICD-10-CM

## 2014-02-12 NOTE — Telephone Encounter (Signed)
It is about 10d early - is she going out of town?  Did something happen where she needed to take more?  Is she asking early to make sure she has it when she runs out?

## 2014-02-12 NOTE — Telephone Encounter (Signed)
Patient is needing refill on her hydrocodone.   Best#: [id number]

## 2014-02-13 MED ORDER — HYDROCODONE-ACETAMINOPHEN 5-325 MG PO TABS: 1.0000 | ORAL_TABLET | Freq: Three times a day (TID) | ORAL | Status: DC

## 2014-02-13 NOTE — Telephone Encounter (Signed)
Pt was on the phone with scheduling yesterday and just went ahead and put the request in to have it in.

## 2014-02-13 NOTE — Telephone Encounter (Signed)
done

## 2014-02-13 NOTE — Telephone Encounter (Signed)
Pt.notified

## 2014-02-19 ENCOUNTER — Other Ambulatory Visit: Payer: Self-pay | Admitting: Physician Assistant

## 2014-02-19 NOTE — Telephone Encounter (Signed)
Jennifer French, pt has appt 03/31/14. Do you want to RF until then?

## 2014-02-22 NOTE — Telephone Encounter (Signed)
I will refill without a problems - how much is she currently taking?

## 2014-02-22 NOTE — Telephone Encounter (Signed)
Pt called back and reported that she is taking 5 tablets Qhs of the desipramine. I have changed sig and # on Rx and pended RFs through appt. Sarah, pt would also like you to call her anytime this week that you are able to discuss therapy [facility].

## 2014-02-22 NOTE — Telephone Encounter (Signed)
LMOM for CB

## 2014-03-17 ENCOUNTER — Ambulatory Visit: Payer: Self-pay | Admitting: Physician Assistant

## 2014-03-24 ENCOUNTER — Telehealth: Payer: Self-pay

## 2014-03-24 DIAGNOSIS — M549 Dorsalgia, unspecified: Secondary | ICD-10-CM

## 2014-03-24 MED ORDER — HYDROCODONE-ACETAMINOPHEN 5-325 MG PO TABS: 1.0000 | ORAL_TABLET | Freq: Three times a day (TID) | ORAL | Status: DC

## 2014-03-24 NOTE — Telephone Encounter (Signed)
Pt notified that this is up front for p/u 

## 2014-03-24 NOTE — Telephone Encounter (Signed)
Ready

## 2014-03-24 NOTE — Telephone Encounter (Signed)
Refill  Benny LennertSarah Weber   HYDROcodone-acetaminophen (NORCO/VICODIN) 5-325 MG per tablet  [phone]

## 2014-03-31 ENCOUNTER — Encounter: Payer: Self-pay | Admitting: Physician Assistant

## 2014-03-31 ENCOUNTER — Ambulatory Visit (INDEPENDENT_AMBULATORY_CARE_PROVIDER_SITE_OTHER): Payer: 59 | Admitting: Physician Assistant

## 2014-03-31 VITALS — BP 110/70 | HR 94 | Temp 98.9°F | Resp 16 | Wt 125.0 lb

## 2014-03-31 DIAGNOSIS — N94819 Vulvodynia, unspecified: Secondary | ICD-10-CM

## 2014-03-31 DIAGNOSIS — M549 Dorsalgia, unspecified: Secondary | ICD-10-CM

## 2014-03-31 DIAGNOSIS — N898 Other specified noninflammatory disorders of vagina: Secondary | ICD-10-CM

## 2014-03-31 LAB — POCT WET PREP WITH KOH
KOH Prep POC: NEGATIVE
TRICHOMONAS UA: NEGATIVE
Yeast Wet Prep HPF POC: NEGATIVE

## 2014-03-31 MED ORDER — DESIPRAMINE HCL 100 MG PO TABS: 50.0000 mg | ORAL_TABLET | Freq: Every day | ORAL | Status: DC

## 2014-03-31 MED ORDER — METAXALONE 800 MG PO TABS: 800.0000 mg | ORAL_TABLET | Freq: Three times a day (TID) | ORAL | Status: DC

## 2014-03-31 MED ORDER — HYDROCODONE-ACETAMINOPHEN 5-325 MG PO TABS: 1.0000 | ORAL_TABLET | Freq: Three times a day (TID) | ORAL | Status: DC

## 2014-03-31 NOTE — Progress Notes (Signed)
   Subjective:    Patient ID: Jennifer French, female    DOB: 03-13-1971, 43 y.o.   MRN: [id number]  HPI Pt presents to clinic for a recheck.  She is still having intense vaginal introitus pain.  She is unable to sit quickly anymore - she guides herself to a seated position due to the pain.  She is using the lidocaine jelly and thinks it helps only a little - what really helps is her hydrocodone.  She has noticed over the last 2 weeks since her last menses she has had increased vaginal discharge - a clear thin white color - her last menses was normal.  She is having no vaginal odor or itching at this time.  She has seen Bjorn Loserhonda at [facility] and has had a couple of sessions - she has another session today.  Review of Systems  Genitourinary: Positive for vaginal discharge and vaginal pain. Negative for vaginal bleeding.       Objective:   Physical Exam  Vitals reviewed. Constitutional: She is oriented to person, place, and time. She appears well-developed and well-nourished.  HENT:  Head: Normocephalic and atraumatic.  Right Ear: External ear normal.  Left Ear: External ear normal.  Pulmonary/Chest: Effort normal.  Genitourinary:    There is tenderness on the right labia. There is tenderness on the left labia. Vaginal discharge (thick clear/white discharge) found.  Neurological: She is alert and oriented to person, place, and time.  Skin: Skin is warm and dry.  Psychiatric: She has a normal mood and affect. Her behavior is normal. Judgment and thought content normal.  Tearful during exam and when talking about her pain and change in her sexual relationship with her husband. Very frustrated with her pain.   Results for orders placed in visit on 03/31/14  POCT WET PREP WITH KOH      Result Value Ref Range   Trichomonas, UA Negative     Clue Cells Wet Prep HPF POC 0-1     Epithelial Wet Prep HPF POC 3-8     Yeast Wet Prep HPF POC neg     Bacteria Wet Prep HPF POC 4+     RBC Wet Prep HPF POC 0-1     WBC Wet Prep HPF POC 1-4     KOH Prep POC Negative         Assessment & Plan:  Vaginal discharge - Plan: POCT Wet Prep with KOH - even though her exam was concerning for yeast there was none seen on the microscopic examination of the vaginal sample  Vulvodynia - she has been on this about 6 weeks and we would not expect complete resolution of her symptoms as this routinely takes 3 months but she is tolerating the medication well and her pain is severe we will increase today.  At this time she will continue her [facility] floor [facility].  We will also seek a specialist in the field, Rosiland Ozenniz Zolnoun, MD, MPH for additional evaluation and treatment.  Plan: desipramine (NOPRAMIN) 100 MG tablet  Back pain - Skelaxin is working much better for her back pain and she is able to take it at work.  Plan: metaxalone (SKELAXIN) 800 MG tablet, HYDROcodone-acetaminophen (NORCO/VICODIN) 5-325 MG per tablet  Benny LennertSarah Weber PA-C  [facility] 03/31/2014 3:37 PM

## 2014-05-05 ENCOUNTER — Telehealth: Payer: Self-pay

## 2014-05-05 NOTE — Telephone Encounter (Signed)
Patient called and wants to speak with Jennifer LennertSarah French regarding a referral. Patient [facility] and patient wants her to know that she's made herself an appt somewhere else. Patient wants to know if [name] is ok with her going to this other provider. Please return call and advise.

## 2014-05-05 NOTE — Telephone Encounter (Signed)
Patient's appt is with Dr. Annamarie Dawleyevard at [facility].  Please advise.

## 2014-05-06 NOTE — Telephone Encounter (Signed)
Referrals called the office at [phone] and was told sent to wrong fax number originally so we resent records to [phone]

## 2014-05-06 NOTE — Telephone Encounter (Signed)
We had to send her record to [facility] - I will have referrals call and check on that so she has the option if she would like it.  I am ok with her seeing someone else.  Referrals please check on the pt's referral.

## 2014-05-10 NOTE — Telephone Encounter (Signed)
Benny LennertSarah French   Patient is waiting a call from you.  States she has left several message.   [phone]

## 2014-05-11 NOTE — Telephone Encounter (Addendum)
[name],  I spoke with Herbert SetaHeather and let her know you are out of the office this week and will not be able to return her call until 7/6.

## 2014-05-16 ENCOUNTER — Telehealth: Payer: Self-pay | Admitting: Radiology

## 2014-05-16 DIAGNOSIS — N94819 Vulvodynia, unspecified: Secondary | ICD-10-CM

## 2014-05-16 MED ORDER — DESIPRAMINE HCL 100 MG PO TABS: 100.0000 mg | ORAL_TABLET | Freq: Every day | ORAL | Status: DC

## 2014-05-16 NOTE — Telephone Encounter (Signed)
Chart review.  Per OV 01/2014, plan was to increase dose by 10 mg every 5 days up to 100-150 mg/day.  New Rx sent.  Meds ordered this encounter  Medications  . desipramine (NOPRAMIN) 100 MG tablet    Sig: Take 1 tablet (100 mg total) by mouth at bedtime.    Dispense:  90 tablet    Refill:  0    Patient may increase dose by 10 mg every week up to 150 mg daily.    Order Specific Question:  Supervising Provider    Answer:  Ellamae SiaOLITTLE, ROBERT P [[id number]]

## 2014-05-16 NOTE — Telephone Encounter (Signed)
Pharmacy [facility] called about patients Desipramine please advise, patient has ran out has been taking one daily instead of one half daily.

## 2014-05-17 ENCOUNTER — Other Ambulatory Visit: Payer: Self-pay | Admitting: Physician Assistant

## 2014-05-17 DIAGNOSIS — M545 Low back pain, unspecified: Secondary | ICD-10-CM

## 2014-05-17 MED ORDER — HYDROCODONE-ACETAMINOPHEN 5-325 MG PO TABS: 1.0000 | ORAL_TABLET | Freq: Three times a day (TID) | ORAL | Status: DC

## 2014-05-17 NOTE — Telephone Encounter (Signed)
[facility].

## 2014-06-14 ENCOUNTER — Telehealth: Payer: Self-pay

## 2014-06-14 NOTE — Telephone Encounter (Signed)
Patient wants Jennifer LennertSarah French to call her regarding a referral.  Best number [phone]

## 2014-06-14 NOTE — Telephone Encounter (Signed)
Pt would like to have Jennifer LennertSarah French rtn call. She has many concerns about the referral to OBGYN. Advised pt Jennifer's schedule for Urgent Care walk in hours. Pt stated if she was not able to receive a return call she would come in on Thursday.

## 2014-06-17 ENCOUNTER — Ambulatory Visit (INDEPENDENT_AMBULATORY_CARE_PROVIDER_SITE_OTHER): Payer: 59 | Admitting: Physician Assistant

## 2014-06-17 VITALS — BP 122/80 | HR 95 | Temp 98.5°F | Resp 16 | Ht 66.0 in | Wt 126.0 lb

## 2014-06-17 DIAGNOSIS — N94819 Vulvodynia, unspecified: Secondary | ICD-10-CM

## 2014-06-17 DIAGNOSIS — N942 Vaginismus: Secondary | ICD-10-CM

## 2014-06-17 DIAGNOSIS — T6391XA Toxic effect of contact with unspecified venomous animal, accidental (unintentional), initial encounter: Secondary | ICD-10-CM

## 2014-06-17 DIAGNOSIS — T63441A Toxic effect of venom of bees, accidental (unintentional), initial encounter: Secondary | ICD-10-CM

## 2014-06-17 DIAGNOSIS — T63461A Toxic effect of venom of wasps, accidental (unintentional), initial encounter: Secondary | ICD-10-CM

## 2014-06-17 DIAGNOSIS — G43909 Migraine, unspecified, not intractable, without status migrainosus: Secondary | ICD-10-CM

## 2014-06-17 MED ORDER — OXYCODONE-ACETAMINOPHEN 5-325 MG PO TABS: 1.0000 | ORAL_TABLET | Freq: Two times a day (BID) | ORAL | Status: DC | PRN

## 2014-06-17 MED ORDER — RANITIDINE HCL 150 MG PO TABS
150.0000 mg | ORAL_TABLET | Freq: Once | ORAL | Status: AC
  Administered 2014-06-17: 150 mg via ORAL

## 2014-06-17 MED ORDER — CETIRIZINE HCL 10 MG PO TABS
10.0000 mg | ORAL_TABLET | Freq: Once | ORAL | Status: AC
  Administered 2014-06-17: 10 mg via ORAL

## 2014-06-17 MED ORDER — NARATRIPTAN HCL 1 MG PO TABS: 1.0000 mg | ORAL_TABLET | Freq: Once | ORAL | Status: DC | PRN

## 2014-06-17 NOTE — Telephone Encounter (Signed)
She plans to see me tonight.

## 2014-06-17 NOTE — Progress Notes (Signed)
   Subjective:    Patient ID: Jennifer French, female    DOB: 07/22/1971, 43 y.o.   MRN: [id number]  HPI Pt presents to clinic for a recheck and a follow-up since her visit to Dr Estanislado Pandyivard for her vulvodynia and vaginismus.  She is going to be scheduled for botox injections in her vaginal muscles for her Stage 5 vaginismus.  She has continued the desipramine but it is not really helping her pain.  She is starting to have rectal spasms also that are very painful.  She continues to need pain medication daily and has tried some percocet and that helps her pain much more and she only had to take it once daily vs the hydrocodone that she was having to take tid.  She needs me to write a letter on her behalf for her insurance for the botox injections.  She is still doing therapy with Bjorn Loserhonda at [facility] but they are no longer doing any internal therapy and they are just doing biofeedback - the plan is for her to have the botox and then continue the biofeedback to help with the mental aspect that she has had pain for so long.  She is really looking forward to this procedure.  She also sustained a bee sting today on her left lower leg that is itchy and red.  She has done nothing to the area.  Review of Systems     Objective:   Physical Exam  Vitals reviewed. Constitutional: She is oriented to person, place, and time. She appears well-developed and well-nourished.  HENT:  Head: Normocephalic and atraumatic.  Right Ear: External ear normal.  Left Ear: External ear normal.  Pulmonary/Chest: Effort normal.  Neurological: She is alert and oriented to person, place, and time.  Skin: Skin is warm and dry.  Erythematous warm area with central lesion from the sting and induration.  Psychiatric: She has a normal mood and affect. Her behavior is normal. Judgment and thought content normal.       Assessment & Plan:  Bee sting, accidental or unintentional, initial encounter - Plan: ranitidine  (ZANTAC) tablet 150 mg, cetirizine (ZYRTEC) tablet 10 mg  Migraine without status migrainosus, not intractable, unspecified migraine type - Plan: naratriptan (AMERGE) 1 MG TABS tablet  Vulvodynia - Plan: oxyCODONE-acetaminophen (ROXICET) 5-325 MG per tablet  Vaginismus - Plan: oxyCODONE-acetaminophen (ROXICET) 5-325 MG per tablet  Pt got better pain relief from the oxycodone she tried.  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  She is concerned about getting off this medication after her procedure but she does want to come off the medication after her pain is controlled.  We discussed the approach to removing the pain medication from her daily routine.  I will write a letter on her behalf to the insurance company to cover the botox treatment for her vaginismus.  She will use OTC Zantac and Zyrtec for the sting.  We refilled her medication for her migraines tonight.  Benny LennertSarah Weber PA-C  [facility] 06/17/2014 9:34 PM

## 2014-06-23 ENCOUNTER — Encounter: Payer: Self-pay | Admitting: Physician Assistant

## 2014-06-23 DIAGNOSIS — M545 Low back pain, unspecified: Secondary | ICD-10-CM

## 2014-06-26 MED ORDER — HYDROCODONE-ACETAMINOPHEN 5-325 MG PO TABS: 1.0000 | ORAL_TABLET | Freq: Three times a day (TID) | ORAL | Status: DC

## 2014-06-26 NOTE — Telephone Encounter (Signed)
Will switch back to Hydrocodone.  Pt knows that the Rx will be up front for her to pick up.

## 2014-06-27 ENCOUNTER — Encounter: Payer: Self-pay | Admitting: Physician Assistant

## 2014-07-14 ENCOUNTER — Other Ambulatory Visit: Payer: Self-pay | Admitting: Physician Assistant

## 2014-07-14 ENCOUNTER — Encounter: Payer: Self-pay | Admitting: Physician Assistant

## 2014-07-14 DIAGNOSIS — G4700 Insomnia, unspecified: Secondary | ICD-10-CM

## 2014-07-14 DIAGNOSIS — N94819 Vulvodynia, unspecified: Secondary | ICD-10-CM

## 2014-07-14 MED ORDER — DESIPRAMINE HCL 100 MG PO TABS: 100.0000 mg | ORAL_TABLET | Freq: Every day | ORAL | Status: DC

## 2014-07-14 MED ORDER — TRAZODONE HCL 50 MG PO TABS: 50.0000 mg | ORAL_TABLET | Freq: Every evening | ORAL | Status: DC | PRN

## 2014-07-15 MED ORDER — DESIPRAMINE HCL 100 MG PO TABS: 100.0000 mg | ORAL_TABLET | Freq: Every day | ORAL | Status: DC

## 2014-07-20 ENCOUNTER — Other Ambulatory Visit: Payer: Self-pay | Admitting: Physician Assistant

## 2014-07-20 DIAGNOSIS — M545 Low back pain, unspecified: Secondary | ICD-10-CM

## 2014-07-20 MED ORDER — HYDROCODONE-ACETAMINOPHEN 5-325 MG PO TABS: 1.0000 | ORAL_TABLET | Freq: Three times a day (TID) | ORAL | Status: DC

## 2014-07-20 NOTE — Telephone Encounter (Signed)
Please place up front - pt knows through my chart 

## 2014-07-21 NOTE — Telephone Encounter (Signed)
Rx in drawer. 

## 2014-07-30 ENCOUNTER — Encounter: Payer: Self-pay | Admitting: Physician Assistant

## 2014-08-13 ENCOUNTER — Encounter (HOSPITAL_COMMUNITY): Admission: RE | Payer: Self-pay | Source: Ambulatory Visit

## 2014-08-13 ENCOUNTER — Ambulatory Visit (HOSPITAL_COMMUNITY): Admission: RE | Admit: 2014-08-13 | Payer: 59 | Source: Ambulatory Visit | Admitting: Obstetrics and Gynecology

## 2014-08-13 SURGERY — EXAM UNDER ANESTHESIA
Anesthesia: Choice

## 2014-08-16 ENCOUNTER — Encounter: Payer: Self-pay | Admitting: Physician Assistant

## 2014-08-16 ENCOUNTER — Other Ambulatory Visit: Payer: Self-pay | Admitting: Physician Assistant

## 2014-08-16 DIAGNOSIS — M545 Low back pain, unspecified: Secondary | ICD-10-CM

## 2014-08-17 MED ORDER — HYDROCODONE-ACETAMINOPHEN 5-325 MG PO TABS: 1.0000 | ORAL_TABLET | Freq: Three times a day (TID) | ORAL | Status: DC

## 2014-08-17 NOTE — Telephone Encounter (Signed)
Please repeat the [place] [facility].

## 2014-08-18 NOTE — Telephone Encounter (Signed)
Notified pt Rx is ready. 

## 2014-08-27 ENCOUNTER — Other Ambulatory Visit: Payer: Self-pay

## 2014-09-04 ENCOUNTER — Encounter: Payer: Self-pay | Admitting: Physician Assistant

## 2014-09-15 ENCOUNTER — Other Ambulatory Visit: Payer: Self-pay | Admitting: Physician Assistant

## 2014-09-15 DIAGNOSIS — N94819 Vulvodynia, unspecified: Secondary | ICD-10-CM

## 2014-09-15 DIAGNOSIS — M545 Low back pain, unspecified: Secondary | ICD-10-CM

## 2014-09-15 DIAGNOSIS — G4700 Insomnia, unspecified: Secondary | ICD-10-CM

## 2014-09-16 ENCOUNTER — Telehealth: Payer: Self-pay

## 2014-09-16 ENCOUNTER — Encounter: Payer: Self-pay | Admitting: Physician Assistant

## 2014-09-16 MED ORDER — DESIPRAMINE HCL 100 MG PO TABS: 100.0000 mg | ORAL_TABLET | Freq: Every day | ORAL | Status: DC

## 2014-09-16 MED ORDER — HYDROCODONE-ACETAMINOPHEN 5-325 MG PO TABS: 1.0000 | ORAL_TABLET | Freq: Three times a day (TID) | ORAL | Status: DC

## 2014-09-16 NOTE — Telephone Encounter (Signed)
Advised pharmacist to disregard the note to titrate up.

## 2014-09-16 NOTE — Telephone Encounter (Signed)
That note is left over from when she was on 10mg  pills.  The directions should be 100mg  1 po qd.  Sorry I just refilled it based on the mg.

## 2014-09-16 NOTE — Telephone Encounter (Signed)
Sarah, pharmacist called for clarification on Rx for desipramine. She wanted to verify it is supposed to be for 100 mg tablets, and if so, how pt can titrate up by 10 mg increments. Does she need to have a separate Rx for 10 mg tablets? Or was the Rx supposed to be for 10 mg tabs instead?

## 2014-09-16 NOTE — Telephone Encounter (Signed)
Rx in drawer. 

## 2014-09-16 NOTE — Telephone Encounter (Signed)
Rx sent and ready for pick-up.  I have sent the pt a MyChart message.

## 2014-10-14 ENCOUNTER — Encounter: Payer: Self-pay | Admitting: Physician Assistant

## 2014-10-14 ENCOUNTER — Other Ambulatory Visit: Payer: Self-pay | Admitting: Physician Assistant

## 2014-10-14 DIAGNOSIS — M545 Low back pain, unspecified: Secondary | ICD-10-CM

## 2014-10-14 MED ORDER — HYDROCODONE-ACETAMINOPHEN 5-325 MG PO TABS: 1.0000 | ORAL_TABLET | Freq: Three times a day (TID) | ORAL | Status: DC

## 2014-10-14 NOTE — Telephone Encounter (Signed)
Ready

## 2014-10-15 NOTE — Telephone Encounter (Signed)
Rx in drawer, pt notified MyChart

## 2014-11-11 ENCOUNTER — Encounter: Payer: Self-pay | Admitting: Physician Assistant

## 2014-11-11 ENCOUNTER — Other Ambulatory Visit: Payer: Self-pay

## 2014-11-11 ENCOUNTER — Telehealth: Payer: Self-pay | Admitting: Physician Assistant

## 2014-11-11 DIAGNOSIS — Z1231 Encounter for screening mammogram for malignant neoplasm of breast: Secondary | ICD-10-CM

## 2014-11-11 DIAGNOSIS — M545 Low back pain, unspecified: Secondary | ICD-10-CM

## 2014-11-13 MED ORDER — HYDROCODONE-ACETAMINOPHEN 5-325 MG PO TABS: 1.0000 | ORAL_TABLET | Freq: Three times a day (TID) | ORAL | Status: DC

## 2014-11-13 NOTE — Telephone Encounter (Signed)
Pt knows through  My chart - please leave up front.

## 2014-11-14 ENCOUNTER — Encounter: Payer: Self-pay | Admitting: Physician Assistant

## 2014-11-14 NOTE — Telephone Encounter (Signed)
Medication prescription was picked up 11/14/14

## 2014-11-15 NOTE — Telephone Encounter (Signed)
Pt has already picked up

## 2014-11-23 ENCOUNTER — Other Ambulatory Visit: Payer: Self-pay | Admitting: Obstetrics and Gynecology

## 2014-11-23 DIAGNOSIS — M2420 Disorder of ligament, unspecified site: Secondary | ICD-10-CM

## 2014-11-23 DIAGNOSIS — G588 Other specified mononeuropathies: Secondary | ICD-10-CM

## 2014-11-26 ENCOUNTER — Ambulatory Visit: Payer: Self-pay

## 2014-11-29 ENCOUNTER — Other Ambulatory Visit: Payer: Self-pay

## 2014-11-30 ENCOUNTER — Ambulatory Visit
Admission: RE | Admit: 2014-11-30 | Discharge: 2014-11-30 | Disposition: A | Discharge status: Home / Self Care | Payer: 59 | Source: Ambulatory Visit | Attending: Obstetrics and Gynecology | Admitting: Obstetrics and Gynecology

## 2014-11-30 DIAGNOSIS — G588 Other specified mononeuropathies: Secondary | ICD-10-CM

## 2014-11-30 MED ORDER — GADOBENATE DIMEGLUMINE 529 MG/ML IV SOLN
10.0000 mL | Freq: Once | INTRAVENOUS | Status: AC | PRN
  Administered 2014-11-30: 10 mL via INTRAVENOUS

## 2014-12-09 ENCOUNTER — Other Ambulatory Visit: Payer: Self-pay | Admitting: Physician Assistant

## 2014-12-09 ENCOUNTER — Encounter: Payer: Self-pay | Admitting: Physician Assistant

## 2014-12-09 DIAGNOSIS — M545 Low back pain, unspecified: Secondary | ICD-10-CM

## 2014-12-09 MED ORDER — HYDROCODONE-ACETAMINOPHEN 5-325 MG PO TABS: 1.0000 | ORAL_TABLET | Freq: Three times a day (TID) | ORAL | Status: DC

## 2014-12-09 NOTE — Telephone Encounter (Signed)
Ready for pick-up - she has been told to pick through my chart

## 2014-12-09 NOTE — Telephone Encounter (Signed)
Rx in drawer. 

## 2014-12-17 ENCOUNTER — Other Ambulatory Visit: Payer: Self-pay | Admitting: Physician Assistant

## 2014-12-23 ENCOUNTER — Other Ambulatory Visit: Payer: Self-pay | Admitting: Physician Assistant

## 2014-12-28 ENCOUNTER — Ambulatory Visit (INDEPENDENT_AMBULATORY_CARE_PROVIDER_SITE_OTHER): Payer: 59 | Admitting: Physician Assistant

## 2014-12-28 VITALS — BP 124/72 | HR 98 | Temp 98.8°F | Resp 16 | Ht 65.0 in | Wt 118.8 lb

## 2014-12-28 DIAGNOSIS — M792 Neuralgia and neuritis, unspecified: Secondary | ICD-10-CM | POA: Insufficient documentation

## 2014-12-28 DIAGNOSIS — N94819 Vulvodynia, unspecified: Secondary | ICD-10-CM

## 2014-12-28 DIAGNOSIS — M549 Dorsalgia, unspecified: Secondary | ICD-10-CM

## 2014-12-28 DIAGNOSIS — G8929 Other chronic pain: Secondary | ICD-10-CM

## 2014-12-28 MED ORDER — DESIPRAMINE HCL 25 MG PO TABS: ORAL_TABLET | ORAL | Status: DC

## 2014-12-28 MED ORDER — HYDROCODONE-ACETAMINOPHEN 10-325 MG PO TABS: 1.0000 | ORAL_TABLET | ORAL | Status: DC | PRN

## 2014-12-28 NOTE — Progress Notes (Signed)
   Subjective:    Patient ID: Jennifer French, female    DOB: 16-Mar-1971, 44 y.o.   MRN: [id number]  HPI Pt presents to clinic because she is in terrible pain.  She has been diagnosed with pudendal nerve entrapment by Dr Estanislado Pandyivard.  The steroid injection around the nerve gave her 8 hours of pain free time but since then the pain is even worse.  She is scheduled in 10d for a botox injection of that nerve and is hoping for pain relief.  If that does not help she will plan on having nerve cut  Because she cannot deal with this pain.  Dr Estanislado Pandyivard has given her Valium and toradol and that does not help with her pain.  She is not able to sit flat today.  She had to call into work today due to the pain.  She has been in tears for the last 2 days due to the pain and she states she cannot work or live like this.  She had to cancel her pelvic floor therapy due to her pain.  Norco 5 is not helping her pain.  Oxy make her to sleepy.  Review of Systems     Objective:   Physical Exam  Constitutional: She is oriented to person, place, and time. She appears well-developed and well-nourished.  BP 124/72 mmHg  Pulse 98  Temp(Src) 98.8 F (37.1 C) (Oral)  Resp 16  Ht 5\' 5"  (1.651 m)  Wt 118 lb 12.8 oz (53.887 kg)  BMI 19.77 kg/m2  SpO2 99%   HENT:  Head: Normocephalic and atraumatic.  Right Ear: External ear normal.  Left Ear: External ear normal.  Pulmonary/Chest: Effort normal.  Neurological: She is alert and oriented to person, place, and time.  Skin: Skin is warm and dry.  Psychiatric: Her behavior is normal. Judgment and thought content normal.  tearful   Spent 30 mins in room with patient with 100% spent in counseling and reviewing her condition with her.    Assessment & Plan:  Vulvodynia - Plan: HYDROcodone-acetaminophen (NORCO) 10-325 MG per tablet, desipramine (NORPRAMIN) 25 MG tablet  Chronic back pain - Plan: HYDROcodone-acetaminophen (NORCO) 10-325 MG per tablet  Nerve pain - Plan:  HYDROcodone-acetaminophen (NORCO) 10-325 MG per tablet  In 11 days the patient has a procedure that is hoped to relieve her pain permament.  I am more than happy to write her increased dose of medication until that time.  She is ready to go off the desipramine and we will titrate her off that over the next 3-6 weeks depending on her tolerance to the decrease titration.  She will add motrin 800mg  tid to see if that will help with her pain control until her procedure.  If the botox injection does not help we will start to research who treats pudendal nerve entrapment.  She is hopeful that the botox will work.  Benny LennertSarah Weber PA-C  [facility] 12/28/2014 3:12 PM

## 2015-01-18 ENCOUNTER — Telehealth: Payer: Self-pay | Admitting: Physician Assistant

## 2015-01-18 ENCOUNTER — Encounter: Payer: Self-pay | Admitting: Physician Assistant

## 2015-01-18 DIAGNOSIS — G8929 Other chronic pain: Secondary | ICD-10-CM

## 2015-01-18 DIAGNOSIS — N94819 Vulvodynia, unspecified: Secondary | ICD-10-CM

## 2015-01-18 DIAGNOSIS — M792 Neuralgia and neuritis, unspecified: Secondary | ICD-10-CM

## 2015-01-18 DIAGNOSIS — M549 Dorsalgia, unspecified: Secondary | ICD-10-CM

## 2015-01-18 MED ORDER — HYDROCODONE-ACETAMINOPHEN 10-325 MG PO TABS: 1.0000 | ORAL_TABLET | ORAL | Status: DC | PRN

## 2015-01-18 NOTE — Telephone Encounter (Signed)
Ready - pt knows through mychart. 

## 2015-01-18 NOTE — Telephone Encounter (Signed)
Rx in drawer. 

## 2015-02-04 ENCOUNTER — Other Ambulatory Visit: Payer: Self-pay | Admitting: Physician Assistant

## 2015-02-04 NOTE — Telephone Encounter (Signed)
[name], I'm sure that you would want pt to have RFs of this, but haven't seen her for migraines recently. OK to RF?

## 2015-02-12 ENCOUNTER — Other Ambulatory Visit: Payer: Self-pay | Admitting: Physician Assistant

## 2015-02-12 DIAGNOSIS — M549 Dorsalgia, unspecified: Secondary | ICD-10-CM

## 2015-02-12 DIAGNOSIS — N94819 Vulvodynia, unspecified: Secondary | ICD-10-CM

## 2015-02-12 DIAGNOSIS — G8929 Other chronic pain: Secondary | ICD-10-CM

## 2015-02-12 DIAGNOSIS — M792 Neuralgia and neuritis, unspecified: Secondary | ICD-10-CM

## 2015-02-14 NOTE — Telephone Encounter (Signed)
Patient called to see if her Hydrocodone refill is ready.  [phone]

## 2015-02-15 ENCOUNTER — Other Ambulatory Visit: Payer: Self-pay | Admitting: Physician Assistant

## 2015-02-15 DIAGNOSIS — M549 Dorsalgia, unspecified: Secondary | ICD-10-CM

## 2015-02-15 DIAGNOSIS — M792 Neuralgia and neuritis, unspecified: Secondary | ICD-10-CM

## 2015-02-15 DIAGNOSIS — N94819 Vulvodynia, unspecified: Secondary | ICD-10-CM

## 2015-02-15 DIAGNOSIS — G8929 Other chronic pain: Secondary | ICD-10-CM

## 2015-02-15 MED ORDER — HYDROCODONE-ACETAMINOPHEN 10-325 MG PO TABS: 1.0000 | ORAL_TABLET | ORAL | Status: DC | PRN

## 2015-02-15 NOTE — Telephone Encounter (Signed)
Patient notified via My Chart.  Meds ordered this encounter  Medications  . HYDROcodone-acetaminophen (NORCO) 10-325 MG per tablet    Sig: Take 1 tablet by mouth every 4 (four) hours as needed.    Dispense:  120 tablet    Refill:  0    Order Specific Question:  Supervising Provider    Answer:  DOOLITTLE, ROBERT P [[id number]]

## 2015-02-22 ENCOUNTER — Other Ambulatory Visit (HOSPITAL_COMMUNITY): Payer: Self-pay | Admitting: Obstetrics and Gynecology

## 2015-02-22 DIAGNOSIS — Z308 Encounter for other contraceptive management: Secondary | ICD-10-CM

## 2015-02-22 DIAGNOSIS — Z9851 Tubal ligation status: Secondary | ICD-10-CM

## 2015-03-04 ENCOUNTER — Ambulatory Visit (INDEPENDENT_AMBULATORY_CARE_PROVIDER_SITE_OTHER): Payer: 59 | Admitting: Physician Assistant

## 2015-03-04 VITALS — BP 110/58 | HR 102 | Temp 98.8°F | Resp 18 | Ht 65.0 in | Wt 125.2 lb

## 2015-03-04 DIAGNOSIS — R059 Cough, unspecified: Secondary | ICD-10-CM

## 2015-03-04 DIAGNOSIS — R350 Frequency of micturition: Secondary | ICD-10-CM

## 2015-03-04 DIAGNOSIS — N94819 Vulvodynia, unspecified: Secondary | ICD-10-CM | POA: Diagnosis not present

## 2015-03-04 DIAGNOSIS — M549 Dorsalgia, unspecified: Secondary | ICD-10-CM | POA: Diagnosis not present

## 2015-03-04 DIAGNOSIS — G8929 Other chronic pain: Secondary | ICD-10-CM

## 2015-03-04 DIAGNOSIS — R05 Cough: Secondary | ICD-10-CM | POA: Diagnosis not present

## 2015-03-04 DIAGNOSIS — G588 Other specified mononeuropathies: Secondary | ICD-10-CM | POA: Insufficient documentation

## 2015-03-04 DIAGNOSIS — M792 Neuralgia and neuritis, unspecified: Secondary | ICD-10-CM

## 2015-03-04 DIAGNOSIS — J029 Acute pharyngitis, unspecified: Secondary | ICD-10-CM

## 2015-03-04 LAB — POCT URINALYSIS DIPSTICK
Bilirubin, UA: NEGATIVE
Glucose, UA: NEGATIVE
KETONES UA: NEGATIVE
Leukocytes, UA: NEGATIVE
Nitrite, UA: NEGATIVE
Protein, UA: NEGATIVE
SPEC GRAV UA: 1.01
Urobilinogen, UA: 0.2
pH, UA: 6

## 2015-03-04 LAB — POCT UA - MICROSCOPIC ONLY
Bacteria, U Microscopic: NEGATIVE
CASTS, UR, LPF, POC: NEGATIVE
Crystals, Ur, HPF, POC: NEGATIVE
MUCUS UA: NEGATIVE
RBC, URINE, MICROSCOPIC: NEGATIVE
YEAST UA: NEGATIVE

## 2015-03-04 MED ORDER — BENZONATATE 100 MG PO CAPS: ORAL_CAPSULE | ORAL | Status: AC

## 2015-03-04 MED ORDER — HYDROCODONE-ACETAMINOPHEN 10-325 MG PO TABS: 1.0000 | ORAL_TABLET | ORAL | Status: DC | PRN

## 2015-03-04 NOTE — Progress Notes (Signed)
Subjective:    Patient ID: Jennifer French, female    DOB: 1971/11/04, 44 y.o.   MRN: [id number]  HPI  Jennifer French is a 44 year old female who presents today for evaluation of right ear pain, sore throat, and cough as well as a recheck of her chronic pain and med refills.  She has had a sore throat and cough for one month. Right ear pain has been present only for a couple of days. Cough has been non-productive and she feels that the cough is coming from her throat and not from her chest. Denies shortness of breath or chest pain. Mild tenderness in her neck from the sore throat. Denies congestion, post nasal drip, or sinus pressure. Denies seasonal allergies. She has taken Tylenol Cold & Flu for the last month,without relief. Also has clear rhinorrhea. She was blowing her nose this morning and a "blood clot" came out and she instantly felt better. Her husband was recently sick but did not seek treatment. He took Sudafed and his symptoms were relieved.   She has pudendal nerve entrapment diagnosed via MRI. It causes extreme vaginal pain and dyspareunia. She recently made an organized medication schedule where she is religiously taking Hydrocodone every 4 hours for pain control. Her pain seems to be better controlled, but she is not pain free. She needs a refill of Hydrocodone today. She has seen ortho and neurosurgery who both have said they will not do surgery due to how high the entrapment is they are concerned about rectal control. She has been referred to [facility] clinic for further management and plans to go to see if there are any other modalities that might help her pain.   Last week she increased her Lyrica dose from 100 mg twice a day to 200 mg twice a day for the pudendal nerve entrapment pain. She is not sure that the Lyrica is helping but she is willing to try it.  Around the same as she increased her Lyrica, she started experiencing urinary urgency and frequency. It sensation has  been so intense she had a urinary accident.  Denies any worsening vaginal pain, dysuria, or abdominal pain.  She uses Valium vaginal suppositories nightly  Review of Systems  Constitutional: Negative for fever, chills, diaphoresis and fatigue.  HENT: Positive for ear pain (Right ear pain), rhinorrhea and sore throat. Negative for congestion, ear discharge, hearing loss, postnasal drip, sinus pressure, sneezing and trouble swallowing.   Respiratory: Positive for cough. Negative for chest tightness and shortness of breath.   Cardiovascular: Negative for chest pain and palpitations.  Gastrointestinal: Negative for nausea, vomiting, abdominal pain, diarrhea and constipation.  Genitourinary: Positive for urgency, frequency and vaginal pain. Negative for dysuria.  Allergic/Immunologic: Negative for environmental allergies.  Neurological: Negative for dizziness, light-headedness and headaches.       Objective:   Physical Exam  Constitutional: She is oriented to person, place, and time. She appears well-developed and well-nourished. No distress.  BP 110/58 mmHg  Pulse 102  Temp(Src) 98.8 F (37.1 C) (Oral)  Resp 18  Ht  (1.651 m)  Wt 125 lb 3.2 oz (56.79 kg)  BMI 20.83 kg/m2  SpO2 99%  LMP 03/04/2015  HENT:  Head: Normocephalic and atraumatic.  Right Ear: Hearing, tympanic membrane, external ear and ear canal normal.  Left Ear: Hearing, tympanic membrane, external ear and ear canal normal.  Nose: Mucosal edema (red) and rhinorrhea present.  Mouth/Throat: Uvula is midline. Posterior oropharyngeal erythema present. No oropharyngeal exudate  or posterior oropharyngeal edema.  Eyes: Conjunctivae are normal. Pupils are equal, round, and reactive to light. No scleral icterus.  Neck: Neck supple.  Cardiovascular: Normal rate, regular rhythm and normal heart sounds.  Exam reveals no gallop and no friction rub.   No murmur heard. Pulmonary/Chest: Effort normal and breath sounds normal. She  has no wheezes. She has no rales.  Lymphadenopathy:       Head (right side): No tonsillar and no occipital adenopathy present.       Head (left side): No tonsillar and no occipital adenopathy present.    She has no cervical adenopathy.       Right: No supraclavicular adenopathy present.       Left: No supraclavicular adenopathy present.  Neurological: She is alert and oriented to person, place, and time.  Skin: Skin is warm and dry. No rash noted. No erythema.  Psychiatric: She has a normal mood and affect.   Results for orders placed or performed in visit on 03/04/15  POCT UA - Microscopic Only  Result Value Ref Range   WBC, Ur, HPF, POC 0-1    RBC, urine, microscopic neg    Bacteria, U Microscopic neg    Mucus, UA neg    Epithelial cells, urine per micros 0-1    Crystals, Ur, HPF, POC neg    Casts, Ur, LPF, POC neg    Yeast, UA neg   POCT urinalysis dipstick  Result Value Ref Range   Color, UA yellow    Clarity, UA clear    Glucose, UA neg    Bilirubin, UA neg    Ketones, UA neg    Spec Grav, UA 1.010    Blood, UA trace-lysed    pH, UA 6.0    Protein, UA neg    Urobilinogen, UA 0.2    Nitrite, UA neg    Leukocytes, UA Negative        Assessment & Plan:  1. Urinary frequency Urine tests were clear and did not indicate UTI. It is possible that the urinary frequency and urgency are related to the pudendal nerve entrapment, or it is possible that it may be a side effect of Lyrica. Discussed decreasing the Lyrica to 100 mg twice a day for one week to see if the urinary symptoms resolve. If so, it may have been related to the Lyrica. We will adjust the dose of Lyrica in one week and reevaluate symptoms at that time.  - POCT UA - Microscopic Only - POCT urinalysis dipstick  2. Vulvodynia Stable. Continue current regimen of Hydrocodone for pain.  - HYDROcodone-acetaminophen (NORCO) 10-325 MG per tablet; Take 1 tablet by mouth every 4 (four) hours as needed.  Dispense: 180  tablet; Refill: 0  3. Chronic back pain Stable. Continue current regimen of Hydrocodone for pain.  - HYDROcodone-acetaminophen (NORCO) 10-325 MG per tablet; Take 1 tablet by mouth every 4 (four) hours as needed.  Dispense: 180 tablet; Refill: 0  4. Nerve pain 5. Pudendal neuralgia Not a new problem. Continues to have pain. Refilled prescription. Pt will plan on f/u with pain management thought we did talk about if they have no additional modalities other then pills I am happy to write her pain medications.- HYDROcodone-acetaminophen (NORCO) 10-325 MG per tablet; Take 1 tablet by mouth every 4 (four) hours as needed.  Dispense: 180 tablet; Refill: 0  6. Cough 7. Sore throat New problems. Prescribed medication and advised her to get Sudafed or Zyrtec D over-the-counter.  This  is most likely allergies and she will do symptomatic treatment. - benzonatate (TESSALON) 100 MG capsule; 1-2 po tid prn cough  Dispense: 40 capsule; Refill: 0

## 2015-03-30 ENCOUNTER — Telehealth: Payer: Self-pay | Admitting: Physician Assistant

## 2015-03-30 DIAGNOSIS — M792 Neuralgia and neuritis, unspecified: Secondary | ICD-10-CM

## 2015-03-30 DIAGNOSIS — N94819 Vulvodynia, unspecified: Secondary | ICD-10-CM

## 2015-03-30 DIAGNOSIS — M549 Dorsalgia, unspecified: Secondary | ICD-10-CM

## 2015-03-30 DIAGNOSIS — G8929 Other chronic pain: Secondary | ICD-10-CM

## 2015-03-30 MED ORDER — HYDROCODONE-ACETAMINOPHEN 10-325 MG PO TABS: 1.0000 | ORAL_TABLET | ORAL | Status: DC | PRN

## 2015-03-30 NOTE — Telephone Encounter (Signed)
Ready - pt knows through my chart. (I will sign after my clinic)

## 2015-03-30 NOTE — Addendum Note (Signed)
Addended by: Morrell RiddleWEBER, SARAH L on: 03/30/2015 01:21 PM   Modules accepted: Orders

## 2015-03-31 NOTE — Telephone Encounter (Signed)
Rx in drawer. 

## 2015-04-19 ENCOUNTER — Ambulatory Visit (HOSPITAL_COMMUNITY)
Admission: RE | Admit: 2015-04-19 | Discharge: 2015-04-19 | Disposition: A | Discharge status: Home / Self Care | Payer: 59 | Source: Ambulatory Visit | Attending: Obstetrics and Gynecology | Admitting: Obstetrics and Gynecology

## 2015-04-19 ENCOUNTER — Ambulatory Visit (HOSPITAL_COMMUNITY): Payer: 59

## 2015-04-19 DIAGNOSIS — Z308 Encounter for other contraceptive management: Secondary | ICD-10-CM

## 2015-04-19 DIAGNOSIS — Z3049 Encounter for surveillance of other contraceptives: Secondary | ICD-10-CM | POA: Diagnosis present

## 2015-04-19 DIAGNOSIS — Z9851 Tubal ligation status: Secondary | ICD-10-CM

## 2015-04-19 MED ORDER — IOHEXOL 300 MG/ML  SOLN
30.0000 mL | Freq: Once | INTRAMUSCULAR | Status: AC | PRN
  Administered 2015-04-19: 30 mL

## 2015-04-19 NOTE — Procedures (Addendum)
Jennifer AlyHeather French is a 44 y.o. female, G0P0, who is status post Essure sterilization 12 weeks ago without complication.   Procedure with R&B was reviewed and patient was consented.   A speculum was inserted in the vagina and cervix was prepped with Betadine after confirming absence of allergy.  The Hysterogram catheter was inserted without difficulty and its balloon was inflated with air.  The speculum was removed.  We proceeded to obtain all required images to confirm adequate coil placement in the right tube with confirmed occlusion and distal placement of the left coil with confirmed occlusion. We are unable to measure distance from the catheter to the cornua but it appears < 3 cm.  The catheter was removed.  Images were reviewed with Dr Eppie GibsonStahl  who confirms our findings.   Findings were reviewed with patient who is advise not to rely on Essure at this time until further review of literature despite apparent occlusion of the tube. The patient is discharged home in a well and stable condition.   Silverio LaySandra Becket Wecker MD

## 2015-04-27 ENCOUNTER — Telehealth: Payer: Self-pay | Admitting: Physician Assistant

## 2015-04-27 DIAGNOSIS — N94819 Vulvodynia, unspecified: Secondary | ICD-10-CM

## 2015-04-27 DIAGNOSIS — M549 Dorsalgia, unspecified: Secondary | ICD-10-CM

## 2015-04-27 DIAGNOSIS — G8929 Other chronic pain: Secondary | ICD-10-CM

## 2015-04-27 DIAGNOSIS — M792 Neuralgia and neuritis, unspecified: Secondary | ICD-10-CM

## 2015-04-28 MED ORDER — HYDROCODONE-ACETAMINOPHEN 10-325 MG PO TABS: 1.0000 | ORAL_TABLET | ORAL | Status: DC | PRN

## 2015-04-28 NOTE — Telephone Encounter (Signed)
Done - pt knows through my chart 

## 2015-04-28 NOTE — Addendum Note (Signed)
Addended by: Morrell Riddle on: 04/28/2015 09:38 PM   Modules accepted: Orders

## 2015-04-29 NOTE — Telephone Encounter (Signed)
In drawer

## 2015-05-03 ENCOUNTER — Encounter: Payer: Self-pay | Admitting: Physician Assistant

## 2015-05-18 ENCOUNTER — Ambulatory Visit (INDEPENDENT_AMBULATORY_CARE_PROVIDER_SITE_OTHER): Payer: 59

## 2015-05-18 ENCOUNTER — Encounter: Payer: Self-pay | Admitting: Physician Assistant

## 2015-05-18 ENCOUNTER — Ambulatory Visit: Payer: 59

## 2015-05-18 ENCOUNTER — Ambulatory Visit (INDEPENDENT_AMBULATORY_CARE_PROVIDER_SITE_OTHER): Payer: 59 | Admitting: Physician Assistant

## 2015-05-18 VITALS — BP 118/64 | HR 115 | Temp 98.8°F | Resp 16 | Wt 128.6 lb

## 2015-05-18 DIAGNOSIS — M549 Dorsalgia, unspecified: Secondary | ICD-10-CM | POA: Diagnosis not present

## 2015-05-18 DIAGNOSIS — M542 Cervicalgia: Secondary | ICD-10-CM

## 2015-05-18 DIAGNOSIS — F4321 Adjustment disorder with depressed mood: Secondary | ICD-10-CM | POA: Diagnosis not present

## 2015-05-18 DIAGNOSIS — G8929 Other chronic pain: Secondary | ICD-10-CM | POA: Diagnosis not present

## 2015-05-18 DIAGNOSIS — N94819 Vulvodynia, unspecified: Secondary | ICD-10-CM | POA: Diagnosis not present

## 2015-05-18 DIAGNOSIS — M792 Neuralgia and neuritis, unspecified: Secondary | ICD-10-CM

## 2015-05-18 MED ORDER — HYDROCODONE-ACETAMINOPHEN 10-325 MG PO TABS: 1.0000 | ORAL_TABLET | ORAL | Status: DC | PRN

## 2015-05-18 MED ORDER — DESIPRAMINE HCL 25 MG PO TABS: 25.0000 mg | ORAL_TABLET | Freq: Every day | ORAL | Status: DC

## 2015-05-18 NOTE — Progress Notes (Signed)
Jennifer AlyHeather Musgrave  MRN: [id number] DOB: 1971-03-02  Subjective:  Pt presents to clinic for medication refills and evaluation of neck pain.   Pt has a history of pudendal nerve entrapment, for which she has undergone extensive work-up and seen several specialists. She comes to clinic here for pain management. She has been taking her pain medicine religiously every 4 hours for pain control. She states that her pain is tolerable, but not completely controlled. She says that she knows that she will never be pain-free with her condition and has accepted this. She would like to get her Rx today though she knows it is not time for her refill but then she does not have to drive here for an extra trip. She says that the Dr. Ethelene Halamos, her orthopedic doctor, was going to try to send her to a pain clinic for pain management, but she really doesn't think that they can do anything for her that Benny LennertSarah Weber, PA-C has not already tried, and she would rather just continue seeing Sarah. Dr. Estanislado Pandyivard attempted using Lyrica to help with her pain, but the pt did not think that it was helping at all, so she stopped taking it.  She is having a phone consult [facility] for possible relocation surgery for her pudendal nerve.  If she goes ahead with it Dr Estanislado Pandyivard is going to go with her for the surgery.  She is planning on seeing what the specialist has to say but she is hesitant.  She had 4 total nerve blocks for her nerve pain but they did not seem to make an overall difference in her pain.  Pt would also like her neck to be evaluated. She states that she has been getting headaches recently, about 3x a month. The headaches are severe, in the occipital region, pulsatile, and associated with neck pain and sometimes nausea. She saw her PT, Bjorn LoserRhonda, during one of these headaches. Pt states that Bjorn LoserRhonda focused a lot on her neck that day, and told the pt that something "doesn't feel right" in her neck and  recommended she get some sort of imaging of her cervical spine. Pt denies neck pain other than when she has the headaches, weakness/numbness/tingling in her neck or upper extremities, and neck stiffness.   Pt also states that she would like to begin taking Desipramine. She originally tried it in February of 2016 to help with pain, but it provided no relief for her pain so she discontinued the medication. She states that she has been feeling very emotional lately and has had a depressed mood d/t her chronic pain. She now realizes that she thinks when she was on the desipramine that her mood was better and she is interested in going back on it.  She tolerated the Desipramine well when she took it previously. She denies thoughts/feelings/plans of hurting herself or others.   Patient Active Problem List   Diagnosis Date Noted  . Pudendal neuralgia 03/04/2015  . Nerve pain 12/28/2014  . Vulvodynia 09/01/2013  . Chronic back pain 08/20/2013  . Migraines 12/01/2012  . IBS (irritable bowel syndrome) 12/01/2012  . SMOKER 08/17/2009  . NEPHROLITHIASIS 08/17/2009    Current Outpatient Prescriptions on File Prior to Visit  Medication Sig Dispense Refill  . lidocaine (XYLOCAINE) 2 % jelly Apply topically as needed. 30 mL 2  . Lidocaine, Anorectal, 5 % GEL Apply 1 application topically 3 (three) times daily. 30 g 12  . naratriptan (AMERGE) 1 MG TABS tablet  TAKE 1 TABLET BY MOUTH AT ONSET OF HEADACHE. MAY REPEAT AFTER 4 HOURS AS NEEDED. DO NOT EXCEED 5 MG IN 24 HOURS. 4 tablet 4   No current facility-administered medications on file prior to visit.    No Known Allergies  Review of Systems  Genitourinary: Positive for pelvic pain (no change).  Neurological: Positive for headaches.   Objective:  BP 118/64 mmHg  Pulse 115  Temp(Src) 98.8 F (37.1 C) (Oral)  Resp 16  Wt 128 lb 9.6 oz (58.333 kg)  SpO2 99%  LMP 04/06/2015  Physical Exam  Constitutional: She is oriented to person, place, and  time and well-developed, well-nourished, and in no distress.  HENT:  Head: Normocephalic and atraumatic.  Right Ear: Hearing and external ear normal.  Left Ear: Hearing and external ear normal.  Eyes: Conjunctivae are normal.  Neck: Normal range of motion.  Pulmonary/Chest: Effort normal.  Musculoskeletal:       Cervical back: She exhibits normal range of motion.  Neurological: She is alert and oriented to person, place, and time. Gait normal.  Skin: Skin is warm and dry.  Psychiatric: Mood, memory, affect and judgment normal.  Vitals reviewed.  [facility] reading (PRIMARY) by  Dr. Merla Riches.  Good ROM demonstrated. No degenerative changes seen.  Assessment and Plan :  Vulvodynia - Plan: HYDROcodone-acetaminophen (NORCO) 10-325 MG per tablet  Chronic back pain - Plan: HYDROcodone-acetaminophen (NORCO) 10-325 MG per tablet, desipramine (NORPRAMIN) 25 MG tablet  Nerve pain - Plan: HYDROcodone-acetaminophen (NORCO) 10-325 MG per tablet  Neck pain - Plan: DG Cervical Spine 2 or 3 views, - continue PT as that will help but her cervical spine looks unremarkable - it is possible these headaches are migraine which she has a history of.  Adjustment disorder with depressed mood - Plan: desipramine (NORPRAMIN) 25 MG tablet - restart the medication as she is interested is going back on this medication and she tolerated it well - she will increase dose every 2 weeks to the dose that helps her mood and in a month we will determine the next dose - in the past she was at  and that will be our goal unless something changes.  She will contact me through mychart with her status regarding her mood.  [name] PA-C  [facility] 05/18/2015 5:36 PM

## 2015-05-18 NOTE — Patient Instructions (Signed)
Continue on pain medications and f/u with Dr Estanislado Pandyivard.  Restart the desipramine at 25mg  for 2 weeks and then increase to 50mg  - then call me with your status and we may stay there are increase back to the 100mg  which is what you were on in the past.

## 2015-05-18 NOTE — Progress Notes (Signed)
Patient ID: Jennifer French, female    DOB: 1971-10-05, 44 y.o.   MRN: [id number]  PCP: Virgilio BellingWEBER,SARAH, PA-C   Subjective:  HPI Pt is a 44 y/o female presenting to clinic for medication refills and evaluation of neck pain.   Pt has a history of pudendal nerve entrapment, for which she has undergone extensive work-up and seen several specialists. She comes to clinic here for pain management. She has been taking her pain medicine religiously every 4 hours for pain control. She states that her pain is tolerable, but not completely controlled. She says that she knows that she will never be pain-free with her condition and has accepted this. She needs a refill of her Norco today. She says that the Dr. Ethelene Halamos, her orthopedic doctor, was going to try to send her to a pain clinic for pain management, but she really doesn't think that they can do anything for her that Benny LennertSarah Weber, PA-C has not already tried, and she would rather just continue seeing Sarah. Dr. Estanislado Pandyivard attempted using Lyrica to help with her pain, but the pt did not think that it was helping at all, so she stopped taking it.   Pt would also like her neck to be evaluated. She states that she has been getting headaches recently, about 3x a month. The headaches are severe, in the occipital region, pulsatile, and associated with neck pain and sometimes nausea. She saw her PT, Bjorn LoserRhonda, during one of these headaches. Pt states that Bjorn LoserRhonda focused a lot on her neck that day, and told the pt that something "doesn't feel right" in her neck and recommended she get some sort of imaging of her cervical spine. Pt denies neck pain other than when she has the headaches, weakness/numbness/tingling in her neck or upper extremities, and neck stiffness.  Pt also states that she would like to begin taking Desipramine. She originally tried it in February of 2016 to help with pain, but it provided no relief for her pain so she discontinued the medication. She states  that she has been feeling very emotional lately and has had a depressed mood d/t her chronic pain. She recalls that the Desipramine helped stabilize her mood significantly and thinks that is something that would help her at this time. She tolerated the Desipramine well when she took it previously. She denies thoughts/feelings/plans of hurting herself or others.  Review of Systems  Constitutional: Negative for fever, chills and activity change.  Eyes: Negative for photophobia, pain and visual disturbance.  Respiratory: Negative.   Cardiovascular: Negative.   Gastrointestinal: Positive for nausea (Sometimes with headaches). Negative for vomiting, diarrhea and constipation.  Genitourinary: Positive for vaginal pain.  Musculoskeletal: Positive for back pain and neck pain (with headaches). Negative for neck stiffness.  Skin: Negative.   Neurological: Positive for headaches. Negative for dizziness, syncope, weakness, light-headedness and numbness.  Psychiatric/Behavioral: Positive for dysphoric mood. Negative for suicidal ideas, hallucinations, behavioral problems, confusion, self-injury, decreased concentration and agitation. The patient is not nervous/anxious and is not hyperactive.      Patient Active Problem List   Diagnosis Date Noted  . Pudendal neuralgia 03/04/2015  . Nerve pain 12/28/2014  . Vulvodynia 09/01/2013  . Chronic back pain 08/20/2013  . Migraines 12/01/2012  . IBS (irritable bowel syndrome) 12/01/2012  . SMOKER 08/17/2009  . NEPHROLITHIASIS 08/17/2009    Past Medical History  Diagnosis Date  . Irritable bowel syndrome (IBS)   . Migraine     Prior to Admission medications   Medication  Sig Start Date End Date Taking? Authorizing Provider  HYDROcodone-acetaminophen (NORCO) 10-325 MG per tablet Take 1 tablet by mouth every 4 (four) hours as needed. 04/28/15  Yes Morrell Riddle, PA-C  lidocaine (XYLOCAINE) 2 % jelly Apply topically as needed. 12/02/13  Yes Morrell Riddle,  PA-C  Lidocaine, Anorectal, 5 % GEL Apply 1 application topically 3 (three) times daily. 01/25/14  Yes Sarah Harvie Bridge, PA-C  naratriptan (AMERGE) 1 MG TABS tablet TAKE 1 TABLET BY MOUTH AT ONSET OF HEADACHE. MAY REPEAT AFTER 4 HOURS AS NEEDED. DO NOT EXCEED 5 MG IN 24 HOURS. 02/04/15  Yes Morrell Riddle, PA-C  tizanidine (ZANAFLEX) 2 MG capsule Take 2 mg by mouth 3 (three) times daily.   Yes Historical Provider, MD    No Known Allergies  Past Medical, Surgical Family and Social History reviewed and updated.   Objective:   Vitals: BP 118/64 mmHg  Pulse 115  Temp(Src) 98.8 F (37.1 C) (Oral)  Resp 16  Wt 128 lb 9.6 oz (58.333 kg)  SpO2 99%  LMP 04/06/2015   Physical Exam  Constitutional: She is oriented to person, place, and time. She appears well-developed and well-nourished. She is cooperative. No distress.  HENT:  Head: Normocephalic and atraumatic.  Right Ear: External ear normal.  Left Ear: External ear normal.  Eyes: Conjunctivae are normal. Pupils are equal, round, and reactive to light.  Neck: Neck supple.  Cardiovascular: Normal rate, regular rhythm and normal heart sounds.  Exam reveals no gallop and no friction rub.   No murmur heard. Pulmonary/Chest: Effort normal and breath sounds normal. No accessory muscle usage. No respiratory distress. She has no wheezes. She has no rhonchi. She has no rales.  Musculoskeletal:       Right shoulder: Normal.       Left shoulder: Normal.       Cervical back: Normal.       Thoracic back: Normal.       Lumbar back: She exhibits decreased range of motion (with extension) and pain. She exhibits no tenderness, no bony tenderness, no swelling, no edema, no deformity and no spasm.  Neurological: She is alert and oriented to person, place, and time. She has normal strength.    [facility] reading (PRIMARY) by  Dr. Merla Riches: Good flexibility with flexion and extension, no degenerative changes seen.   Assessment & Plan:   Xylina was seen  today for follow-up and nerve pain.  Diagnoses and all orders for this visit:  Vulvodynia -    Stable. Continue current regimen and f/u with Dr. Estanislado Pandy. Orders: -     HYDROcodone-acetaminophen (NORCO) 10-325 MG per tablet; Take 1 tablet by mouth every 4 (four) hours as needed.  Chronic back pain Stable. Continue current regimen and f/u with specialists. Orders: -     HYDROcodone-acetaminophen (NORCO) 10-325 MG per tablet; Take 1 tablet by mouth every 4 (four) hours as needed.  Nerve pain Stable. Continue current regimen. Orders: -     HYDROcodone-acetaminophen (NORCO) 10-325 MG per tablet; Take 1 tablet by mouth every 4 (four) hours as needed.  Neck pain -    No indication of bony involvement seen on x-ray in office. Await final reading by radiologist. -     Do not see a need for MRI at this time, pain seems more muscular and stress-related. -    Continue PT. Orders: -     DG Cervical Spine 2 or 3 views; Future  Adjustment disorder with depressed mood -  Plan to taper back on to Desipramine. Pt knows how the medication affects her and it will hopefully stop her from feeling like she is on an Comptroller. -     Will start with  for the first 2 weeks, increase to  the following 2 weeks, and then increase to , if needed at that point. She was previously on , but if she feels good at , then we will keep her at this dose. She will contact the clinic with how she is feeling in about a month and we can adjust prescriptions at that time. Orders: -     desipramine (NORPRAMIN) 25 MG tablet; Take 1-2 tablets (25-50 mg total) by mouth daily.   Amar Keenum, PA-S [facility] 05/18/2015 2:38 PM

## 2015-06-16 ENCOUNTER — Other Ambulatory Visit: Payer: Self-pay | Admitting: Physician Assistant

## 2015-06-17 ENCOUNTER — Other Ambulatory Visit: Payer: Self-pay | Admitting: Physician Assistant

## 2015-06-17 DIAGNOSIS — M792 Neuralgia and neuritis, unspecified: Secondary | ICD-10-CM

## 2015-06-17 DIAGNOSIS — F4321 Adjustment disorder with depressed mood: Secondary | ICD-10-CM

## 2015-06-17 DIAGNOSIS — G8929 Other chronic pain: Secondary | ICD-10-CM

## 2015-06-17 DIAGNOSIS — N94819 Vulvodynia, unspecified: Secondary | ICD-10-CM

## 2015-06-17 DIAGNOSIS — M549 Dorsalgia, unspecified: Secondary | ICD-10-CM

## 2015-06-19 ENCOUNTER — Other Ambulatory Visit: Payer: Self-pay | Admitting: Physician Assistant

## 2015-06-19 DIAGNOSIS — N94819 Vulvodynia, unspecified: Secondary | ICD-10-CM

## 2015-06-19 DIAGNOSIS — M792 Neuralgia and neuritis, unspecified: Secondary | ICD-10-CM

## 2015-06-19 DIAGNOSIS — G8929 Other chronic pain: Secondary | ICD-10-CM

## 2015-06-19 DIAGNOSIS — F4321 Adjustment disorder with depressed mood: Secondary | ICD-10-CM

## 2015-06-19 DIAGNOSIS — M549 Dorsalgia, unspecified: Principal | ICD-10-CM

## 2015-06-19 MED ORDER — DESIPRAMINE HCL 25 MG PO TABS: 25.0000 mg | ORAL_TABLET | Freq: Every day | ORAL | Status: DC

## 2015-06-19 MED ORDER — HYDROCODONE-ACETAMINOPHEN 10-325 MG PO TABS: 1.0000 | ORAL_TABLET | ORAL | Status: DC | PRN

## 2015-06-19 NOTE — Progress Notes (Unsigned)
Refilling in the absence of Huey Romans PA-C. NCCSRS checked and no suspicious activity noted. Deliah Boston, MS, PA-C   2:32 PM, 06/19/2015

## 2015-07-13 ENCOUNTER — Telehealth: Payer: Self-pay | Admitting: Physician Assistant

## 2015-07-13 DIAGNOSIS — M792 Neuralgia and neuritis, unspecified: Secondary | ICD-10-CM

## 2015-07-13 DIAGNOSIS — F4321 Adjustment disorder with depressed mood: Secondary | ICD-10-CM

## 2015-07-13 DIAGNOSIS — N94819 Vulvodynia, unspecified: Secondary | ICD-10-CM

## 2015-07-13 DIAGNOSIS — G8929 Other chronic pain: Secondary | ICD-10-CM

## 2015-07-13 DIAGNOSIS — M549 Dorsalgia, unspecified: Principal | ICD-10-CM

## 2015-07-15 ENCOUNTER — Other Ambulatory Visit: Payer: Self-pay | Admitting: Physician Assistant

## 2015-07-15 DIAGNOSIS — G8929 Other chronic pain: Secondary | ICD-10-CM

## 2015-07-15 DIAGNOSIS — F4321 Adjustment disorder with depressed mood: Secondary | ICD-10-CM

## 2015-07-15 DIAGNOSIS — M549 Dorsalgia, unspecified: Principal | ICD-10-CM

## 2015-07-15 MED ORDER — DESIPRAMINE HCL 25 MG PO TABS: 25.0000 mg | ORAL_TABLET | Freq: Every day | ORAL | Status: DC

## 2015-07-15 NOTE — Telephone Encounter (Signed)
Jennifer French not here in the office, can someone help?

## 2015-07-15 NOTE — Telephone Encounter (Signed)
Contacted patient:  Refilled medication for the desipramine. 60 tablets.  She had a visit with [name], 05/18/2015, was to return for ov visit after 1 month to see how much she increased herself, and to assist in titration at that time.  Therefore I have advised her via voicemail, that she must return to office prior to next refill. Norco refill was 06/19/2015.  Advised that she will have refill after 1 month of that time.  She will have to see Maralyn Sago, or contact Sarah for her protocol for refill.  Appears as if she may see her about every 2 months.Marland KitchenMarland KitchenMarland Kitchen

## 2015-07-15 NOTE — Telephone Encounter (Signed)
Pt states she goes through [facility] all the time to do her med refills because of [name]. She wants a refill on desipramine (NORPRAMIN) 25 MG tablet and HYDROcodone-acetaminophen (NORCO. Desipramine she needs urgent! She doesn't know why another PA is showing up in her chart to refill the rxn?  Please advise [phone]

## 2015-07-19 MED ORDER — DESIPRAMINE HCL 100 MG PO TABS: 100.0000 mg | ORAL_TABLET | Freq: Every day | ORAL | Status: DC

## 2015-07-19 MED ORDER — HYDROCODONE-ACETAMINOPHEN 10-325 MG PO TABS: 1.0000 | ORAL_TABLET | ORAL | Status: DC | PRN

## 2015-07-19 NOTE — Addendum Note (Signed)
Addended by: Morrell Riddle on: 07/19/2015 09:09 AM   Modules accepted: Orders

## 2015-07-19 NOTE — Telephone Encounter (Signed)
Ready - pt knows through mychart. 

## 2015-07-21 ENCOUNTER — Telehealth: Payer: Self-pay | Admitting: Physician Assistant

## 2015-07-21 NOTE — Telephone Encounter (Signed)
Called and spoke with patient. Currently on short term disability because she is having problems with her job.  Talked with specialist who is looking more into her case of pedundal nerve entrapment.

## 2015-08-12 ENCOUNTER — Encounter: Payer: Self-pay | Admitting: Physician Assistant

## 2015-08-12 DIAGNOSIS — N94819 Vulvodynia, unspecified: Secondary | ICD-10-CM

## 2015-08-12 DIAGNOSIS — G8929 Other chronic pain: Secondary | ICD-10-CM

## 2015-08-12 DIAGNOSIS — M549 Dorsalgia, unspecified: Secondary | ICD-10-CM

## 2015-08-12 DIAGNOSIS — M792 Neuralgia and neuritis, unspecified: Secondary | ICD-10-CM

## 2015-08-12 MED ORDER — HYDROCODONE-ACETAMINOPHEN 10-325 MG PO TABS: 1.0000 | ORAL_TABLET | ORAL | Status: DC | PRN

## 2015-08-12 MED ORDER — CYCLOBENZAPRINE HCL 10 MG PO TABS: 10.0000 mg | ORAL_TABLET | Freq: Three times a day (TID) | ORAL | Status: DC | PRN

## 2015-08-12 NOTE — Telephone Encounter (Signed)
Ready - Pt knows through [employer]

## 2015-08-26 ENCOUNTER — Encounter: Payer: Self-pay | Admitting: Physician Assistant

## 2015-08-30 MED ORDER — CYCLOBENZAPRINE HCL 10 MG PO TABS: 10.0000 mg | ORAL_TABLET | Freq: Three times a day (TID) | ORAL | Status: DC | PRN

## 2015-08-30 MED ORDER — DESIPRAMINE HCL 150 MG PO TABS: 150.0000 mg | ORAL_TABLET | Freq: Every day | ORAL | Status: DC

## 2015-09-05 ENCOUNTER — Telehealth: Payer: Self-pay | Admitting: Physician Assistant

## 2015-09-05 DIAGNOSIS — G8929 Other chronic pain: Secondary | ICD-10-CM

## 2015-09-05 DIAGNOSIS — N94819 Vulvodynia, unspecified: Secondary | ICD-10-CM

## 2015-09-05 DIAGNOSIS — M549 Dorsalgia, unspecified: Secondary | ICD-10-CM

## 2015-09-05 DIAGNOSIS — M792 Neuralgia and neuritis, unspecified: Secondary | ICD-10-CM

## 2015-09-06 MED ORDER — HYDROCODONE-ACETAMINOPHEN 10-325 MG PO TABS: 1.0000 | ORAL_TABLET | ORAL | Status: DC | PRN

## 2015-09-06 MED ORDER — CYCLOBENZAPRINE HCL 10 MG PO TABS: 10.0000 mg | ORAL_TABLET | Freq: Three times a day (TID) | ORAL | Status: DC | PRN

## 2015-09-06 NOTE — Addendum Note (Signed)
Addended by: Morrell RiddleWEBER, Nozomi Mettler L on: 09/06/2015 02:43 PM   Modules accepted: Orders

## 2015-09-06 NOTE — Telephone Encounter (Signed)
Done - pt knows through my chart 

## 2015-10-01 ENCOUNTER — Telehealth: Payer: Self-pay | Admitting: Physician Assistant

## 2015-10-01 DIAGNOSIS — M549 Dorsalgia, unspecified: Secondary | ICD-10-CM

## 2015-10-01 DIAGNOSIS — G8929 Other chronic pain: Secondary | ICD-10-CM

## 2015-10-01 DIAGNOSIS — N94819 Vulvodynia, unspecified: Secondary | ICD-10-CM

## 2015-10-01 DIAGNOSIS — M792 Neuralgia and neuritis, unspecified: Secondary | ICD-10-CM

## 2015-10-02 ENCOUNTER — Other Ambulatory Visit: Payer: Self-pay | Admitting: Physician Assistant

## 2015-10-03 MED ORDER — HYDROCODONE-ACETAMINOPHEN 10-325 MG PO TABS: 1.0000 | ORAL_TABLET | ORAL | Status: DC | PRN

## 2015-10-03 NOTE — Telephone Encounter (Signed)
Done - pt knows through my chart 

## 2015-10-25 ENCOUNTER — Telehealth: Payer: Self-pay | Admitting: Physician Assistant

## 2015-10-25 DIAGNOSIS — G8929 Other chronic pain: Secondary | ICD-10-CM

## 2015-10-25 DIAGNOSIS — M549 Dorsalgia, unspecified: Secondary | ICD-10-CM

## 2015-10-25 DIAGNOSIS — N94819 Vulvodynia, unspecified: Secondary | ICD-10-CM

## 2015-10-25 DIAGNOSIS — M792 Neuralgia and neuritis, unspecified: Secondary | ICD-10-CM

## 2015-10-29 ENCOUNTER — Other Ambulatory Visit: Payer: Self-pay | Admitting: Physician Assistant

## 2015-10-29 DIAGNOSIS — M792 Neuralgia and neuritis, unspecified: Secondary | ICD-10-CM

## 2015-10-29 DIAGNOSIS — N94819 Vulvodynia, unspecified: Secondary | ICD-10-CM

## 2015-10-29 DIAGNOSIS — G8929 Other chronic pain: Secondary | ICD-10-CM

## 2015-10-29 DIAGNOSIS — M549 Dorsalgia, unspecified: Secondary | ICD-10-CM

## 2015-10-29 MED ORDER — HYDROCODONE-ACETAMINOPHEN 10-325 MG PO TABS: 1.0000 | ORAL_TABLET | ORAL | Status: DC | PRN

## 2015-10-29 MED ORDER — NORTRIPTYLINE HCL 50 MG PO CAPS: 50.0000 mg | ORAL_CAPSULE | Freq: Every day | ORAL | Status: DC

## 2015-10-29 NOTE — Addendum Note (Signed)
Addended by: Morrell RiddleWEBER, Thelia Tanksley L on: 10/29/2015 02:15 PM   Modules accepted: Orders

## 2015-10-29 NOTE — Telephone Encounter (Signed)
Done - pt knows through my chart 

## 2015-10-30 NOTE — Telephone Encounter (Signed)
rx is ready for pickup at 102

## 2015-11-23 ENCOUNTER — Telehealth: Payer: Self-pay | Admitting: Physician Assistant

## 2015-11-23 DIAGNOSIS — M792 Neuralgia and neuritis, unspecified: Secondary | ICD-10-CM

## 2015-11-23 DIAGNOSIS — G8929 Other chronic pain: Secondary | ICD-10-CM

## 2015-11-23 DIAGNOSIS — N94819 Vulvodynia, unspecified: Secondary | ICD-10-CM

## 2015-11-23 DIAGNOSIS — M549 Dorsalgia, unspecified: Secondary | ICD-10-CM

## 2015-11-24 ENCOUNTER — Encounter: Payer: Self-pay | Admitting: Physician Assistant

## 2015-11-24 MED ORDER — NORTRIPTYLINE HCL 50 MG PO CAPS: 50.0000 mg | ORAL_CAPSULE | Freq: Every day | ORAL | Status: DC

## 2015-11-24 MED ORDER — HYDROCODONE-ACETAMINOPHEN 10-325 MG PO TABS: 1.0000 | ORAL_TABLET | ORAL | Status: DC | PRN

## 2015-11-24 NOTE — Addendum Note (Signed)
Addended by: Morrell RiddleWEBER, SARAH L on: 11/24/2015 10:52 AM   Modules accepted: Orders

## 2015-11-24 NOTE — Telephone Encounter (Signed)
Pt knows through my chart - Rx ready

## 2015-11-24 NOTE — Telephone Encounter (Signed)
Rx in drawer. 

## 2015-12-19 ENCOUNTER — Other Ambulatory Visit: Payer: Self-pay | Admitting: Physician Assistant

## 2015-12-21 ENCOUNTER — Other Ambulatory Visit: Payer: Self-pay | Admitting: Physician Assistant

## 2015-12-23 ENCOUNTER — Other Ambulatory Visit: Payer: Self-pay | Admitting: Physician Assistant

## 2015-12-24 ENCOUNTER — Ambulatory Visit (INDEPENDENT_AMBULATORY_CARE_PROVIDER_SITE_OTHER): Payer: Commercial Managed Care - HMO | Admitting: Physician Assistant

## 2015-12-24 ENCOUNTER — Encounter: Payer: Self-pay | Admitting: Physician Assistant

## 2015-12-24 VITALS — BP 120/80 | HR 103 | Temp 98.8°F | Resp 20 | Ht 65.75 in | Wt 135.2 lb

## 2015-12-24 DIAGNOSIS — F329 Major depressive disorder, single episode, unspecified: Secondary | ICD-10-CM

## 2015-12-24 DIAGNOSIS — M79605 Pain in left leg: Secondary | ICD-10-CM

## 2015-12-24 DIAGNOSIS — G8929 Other chronic pain: Secondary | ICD-10-CM

## 2015-12-24 DIAGNOSIS — N94819 Vulvodynia, unspecified: Secondary | ICD-10-CM | POA: Diagnosis not present

## 2015-12-24 DIAGNOSIS — M79604 Pain in right leg: Secondary | ICD-10-CM | POA: Diagnosis not present

## 2015-12-24 DIAGNOSIS — F32A Depression, unspecified: Secondary | ICD-10-CM

## 2015-12-24 DIAGNOSIS — M792 Neuralgia and neuritis, unspecified: Secondary | ICD-10-CM

## 2015-12-24 DIAGNOSIS — G588 Other specified mononeuropathies: Secondary | ICD-10-CM

## 2015-12-24 DIAGNOSIS — M549 Dorsalgia, unspecified: Secondary | ICD-10-CM | POA: Diagnosis not present

## 2015-12-24 DIAGNOSIS — G43009 Migraine without aura, not intractable, without status migrainosus: Secondary | ICD-10-CM

## 2015-12-24 MED ORDER — PREDNISONE 10 MG PO TABS: ORAL_TABLET | ORAL | Status: AC

## 2015-12-24 MED ORDER — HYDROCODONE-ACETAMINOPHEN 10-325 MG PO TABS: 1.0000 | ORAL_TABLET | ORAL | Status: DC | PRN

## 2015-12-24 MED ORDER — DESIPRAMINE HCL 150 MG PO TABS: 150.0000 mg | ORAL_TABLET | Freq: Every day | ORAL | Status: DC

## 2015-12-24 MED ORDER — MELOXICAM 15 MG PO TABS: 15.0000 mg | ORAL_TABLET | Freq: Every day | ORAL | Status: DC

## 2015-12-24 NOTE — Progress Notes (Signed)
Jennifer French  MRN: [id number] DOB: 1971/06/20  Subjective:  Pt presents to clinic for medication refill and left leg pain.  She needs refills on her medications but she also wants to discuss decreasing her medications because she does not want to be on all these medications.  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  She has not had the mobic for a while but she wonders if that was helping her.  She tried to [employer] but she did not tolerate it - it made her hungry and feel terrible.  She stopped it and all the symptoms resolved.  She stopped the desipramine because of insurance company and the cost but she really feels like that medication helped her the most.  It helped her depression and chronic pain and while she was on it she had no migraines.  She misses that medication because she feels like it really helped her.  She has been smoking marijuana to help with the pain and she is not sure it is helping because she does not want to do it during the day because she is working but she does it at night and she thinks that it may help  Her sleep at night and also her research says that you have to do it for a long period of time and she has just recently started its use.  She really understands that she needs pain medication but it is really hard for her to be on it chronically and she accepts that she needs it but would like to titrate down on the dose.  At 3.5-4h after her last dose she is having pain. Her last dose was last night and she is in terrible pain today and she feels bad with withdrawal symptoms.  She is still doing her pelvic floor therapy that she learned but she is doing it at home.  Her vaginismus is back and she is not interested in getting botox again - she hopes that continuing the pelvic floor exercises she learned it will not get worse.  She  is also having radiating pain down her left side that is keeping her up at night - it is ok during the day - leg is aching and the pain is sharp all at the same time - it is in the distribution of the sciatic nerve.  Patient Active Problem List   Diagnosis Date Noted  . Pudendal neuralgia 03/04/2015  . Nerve pain 12/28/2014  . Vulvodynia 09/01/2013  . Chronic back pain 08/20/2013  . Migraines 12/01/2012  . IBS (irritable bowel syndrome) 12/01/2012  . SMOKER 08/17/2009  . NEPHROLITHIASIS 08/17/2009    Current Outpatient Prescriptions on File Prior to Visit  Medication Sig Dispense Refill  . cyclobenzaprine (FLEXERIL) 10 MG tablet Take 1 tablet (10 mg total) by mouth 3 (three) times daily as needed for muscle spasms. 90 tablet 3  . Lidocaine, Anorectal, 5 % GEL Apply 1 application topically 3 (three) times daily. 30 g 12  . naratriptan (AMERGE) 1 MG TABS tablet TAKE 1 TABLET BY MOUTH AT ONSET OF HEADACHE. MAY REPEAT AFTER 4 HOURS AS NEEDED. DO NOT EXCEED 5 MG IN 24 HOURS. (Patient not taking: Reported on 12/24/2015) 4 tablet 4   No current facility-administered medications on file prior to visit.    No Known Allergies  Review of Systems  Genitourinary: Positive for vaginal pain.  Musculoskeletal: Positive for gait problem.  Psychiatric/Behavioral: Positive for sleep disturbance (2nd to pain) and dysphoric mood (worse since off the desipramine).   Objective:  BP 120/80 mmHg  Pulse 103  Temp(Src) 98.8 F (37.1 C) (Oral)  Resp 20  Ht 5' 5.75" (1.67 m)  Wt 135 lb 3.2 oz (61.326 kg)  BMI 21.99 kg/m2  SpO2 96%  LMP 12/07/2015 (Approximate)  Physical Exam  Constitutional: She is oriented to person, place, and time and well-developed, well-nourished, and in no distress.  HENT:  Head: Normocephalic and atraumatic.  Right Ear: Hearing and external ear normal.  Left Ear: Hearing and external ear normal.  Eyes: Conjunctivae are normal.  Neck: Normal range of motion.    Pulmonary/Chest: Effort normal.  Musculoskeletal:       Right shoulder: She exhibits tenderness (L5S1 area). She exhibits normal range of motion and no spasm.       Lumbar back: She exhibits decreased range of motion, tenderness and spasm.       Legs: Neurological: She is alert and oriented to person, place, and time. She has normal sensation and normal strength. She displays no weakness and normal reflexes. She has a normal Straight Leg Raise Test. Gait normal. Gait normal.  Reflex Scores:      Patellar reflexes are 1+ on the right side and 1+ on the left side.      Achilles reflexes are 1+ on the right side and 1+ on the left side. Skin: Skin is warm and dry.  Psychiatric: Mood, memory, affect and judgment normal.  Tearful at times  Vitals reviewed.   Assessment and Plan :  Vulvodynia - Plan: HYDROcodone-acetaminophen (NORCO) 10-325 MG tablet  Chronic back pain - Plan: HYDROcodone-acetaminophen (NORCO) 10-325 MG tablet, predniSONE (DELTASONE) 10 MG tablet, meloxicam (MOBIC) 15 MG tablet  Nerve pain - Plan: HYDROcodone-acetaminophen (NORCO) 10-325 MG tablet  Pudendal neuralgia - Plan: desipramine (NOPRAMIN) 150 MG tablet  Migraine without aura and without status migrainosus, not intractable - Plan: desipramine (NOPRAMIN) 150 MG tablet  Depression - Plan: desipramine (NOPRAMIN) 150 MG tablet  Right leg pain  Left leg pain - Plan: predniSONE (DELTASONE) 10 MG tablet   Refilled her pain medications and we discussed that I know she wants to wean down but until we get her back on TCA I think that it is not going to be successful.  She will contact her insurance company now that she has failed the Pamelor to see if it will be covered.  We will start mobic and prednisone today for her sciatic nerve pain.  She will continue during there pelvic floor therapy.  [name] PA-C  [facility] 12/24/2015 3:22 PM

## 2015-12-29 ENCOUNTER — Ambulatory Visit: Payer: Self-pay | Admitting: Physician Assistant

## 2016-01-18 ENCOUNTER — Telehealth: Payer: Self-pay | Admitting: Physician Assistant

## 2016-01-18 DIAGNOSIS — G8929 Other chronic pain: Secondary | ICD-10-CM

## 2016-01-18 DIAGNOSIS — M549 Dorsalgia, unspecified: Secondary | ICD-10-CM

## 2016-01-18 DIAGNOSIS — M792 Neuralgia and neuritis, unspecified: Secondary | ICD-10-CM

## 2016-01-18 DIAGNOSIS — N94819 Vulvodynia, unspecified: Secondary | ICD-10-CM

## 2016-01-20 ENCOUNTER — Other Ambulatory Visit: Payer: Self-pay | Admitting: Physician Assistant

## 2016-01-21 MED ORDER — MELOXICAM 15 MG PO TABS: 15.0000 mg | ORAL_TABLET | Freq: Every day | ORAL | Status: DC

## 2016-01-21 MED ORDER — HYDROCODONE-ACETAMINOPHEN 10-325 MG PO TABS: 1.0000 | ORAL_TABLET | ORAL | Status: DC | PRN

## 2016-01-21 NOTE — Addendum Note (Signed)
Addended by: Morrell RiddleWEBER, Zoha Spranger L on: 01/21/2016 04:58 PM   Modules accepted: Orders

## 2016-01-21 NOTE — Telephone Encounter (Signed)
Done - ready to pick up the patient knows through mychart.

## 2016-01-22 ENCOUNTER — Ambulatory Visit (INDEPENDENT_AMBULATORY_CARE_PROVIDER_SITE_OTHER): Payer: Commercial Managed Care - HMO | Admitting: Physician Assistant

## 2016-01-22 VITALS — BP 128/80 | HR 101 | Temp 98.6°F | Resp 17 | Ht 66.0 in | Wt 136.0 lb

## 2016-01-22 DIAGNOSIS — M25561 Pain in right knee: Secondary | ICD-10-CM | POA: Diagnosis not present

## 2016-01-22 NOTE — Progress Notes (Signed)
Jennifer AlyHeather Henckel  MRN: [id number] DOB: [birth year]  Subjective:  Pt presents to clinic with right knee pain that started yesterday.  She does not remember an injury that she knows of.  She is walking up steps a lot more with her new job and she just moved into a new home with steps.  Her pain is mainly above her knee cap and more to the medial aspect.  She has no pain when she is still - she only has pain with movement and resistance - walking up the steps is terrible pain but no pain going down the steps.  She has no problems with just movement of the knee by itself.  She had pain last night when she is turning over when twisting her leg/knee.  Pt has a friend who is a PT.  She has used kinesio tape in the past and she tried to [employer] instructions and she tried it but it has not helped that much.  She has taken no additional medications for her pain.  Patient Active Problem List   Diagnosis Date Noted  . Depression 12/24/2015  . Pudendal neuralgia 03/04/2015  . Nerve pain 12/28/2014  . Vulvodynia 09/01/2013  . Chronic back pain 08/20/2013  . Migraines 12/01/2012  . IBS (irritable bowel syndrome) 12/01/2012  . SMOKER 08/17/2009  . NEPHROLITHIASIS 08/17/2009    Current Outpatient Prescriptions on File Prior to Visit  Medication Sig Dispense Refill  . cyclobenzaprine (FLEXERIL) 10 MG tablet Take 1 tablet (10 mg total) by mouth 3 (three) times daily as needed for muscle spasms. 90 tablet 3  . desipramine (NOPRAMIN) 150 MG tablet Take 1 tablet (150 mg total) by mouth at bedtime. 30 tablet 1  . HYDROcodone-acetaminophen (NORCO) 10-325 MG tablet Take 1 tablet by mouth every 4 (four) hours as needed for severe pain. 180 tablet 0  . Lidocaine, Anorectal, 5 % GEL Apply 1 application topically 3 (three) times daily. 30 g 12  . meloxicam (MOBIC) 15 MG tablet Take 1 tablet (15 mg total) by mouth daily. 30 tablet 12  . naratriptan (AMERGE) 1 MG TABS tablet TAKE 1 TABLET BY MOUTH AT ONSET OF  HEADACHE. MAY REPEAT AFTER 4 HOURS AS NEEDED. DO NOT EXCEED 5 MG IN 24 HOURS. (Patient not taking: Reported on 12/24/2015) 4 tablet 4   No current facility-administered medications on file prior to visit.    No Known Allergies  Review of Systems  Musculoskeletal: Negative for joint swelling and gait problem.   Objective:  BP 128/80 mmHg  Pulse 101  Temp(Src) 98.6 F (37 C) (Oral)  Resp 17  Ht 5\' 6"  (1.676 m)  Wt 136 lb (61.689 kg)  BMI 21.96 kg/m2  SpO2 98%  LMP 01/22/2016  Physical Exam  Constitutional: She is oriented to person, place, and time and well-developed, well-nourished, and in no distress.  HENT:  Head: Normocephalic and atraumatic.  Right Ear: Hearing and external ear normal.  Left Ear: Hearing and external ear normal.  Eyes: Conjunctivae are normal.  Neck: Normal range of motion.  Pulmonary/Chest: Effort normal.  Musculoskeletal:       Right knee: She exhibits normal range of motion, no swelling, no ecchymosis, no erythema, normal alignment, no LCL laxity, normal patellar mobility, no bony tenderness, normal meniscus and no MCL laxity. No tenderness found.       Left knee: Normal.  Right - TTP over suprapubic bursa and just superior to the patella.  She has pain with movement only against resistance -  most of her pain is present when the quadriceps are engaged  Neurological: She is alert and oriented to person, place, and time. Gait normal.  Skin: Skin is warm and dry.  Psychiatric: Mood, memory, affect and judgment normal.  Vitals reviewed.   Assessment and Plan :  Right knee pain -   Ice and rest - esp with movement where the quadriceps are engaged - she will contact her PT friend about taping her knee.  [name] PA-C  [facility] 01/22/2016 11:56 AM

## 2016-01-22 NOTE — Patient Instructions (Addendum)
Dr Christy Sartoriusavid [place] - Back in Control: A Spine Surgeon's Road Map Out of [facility] - [facility]  [place]  IF you received an x-ray today, you will receive an invoice from [facility]. Please contact [facility] at [phone] with questions or concerns regarding your invoice.   IF you received labwork today, you will receive an invoice from [facility]. Please contact [facility] at [phone] with questions or concerns regarding your invoice.   Our billing staff will not be able to assist you with questions regarding bills from these companies.  You will be contacted with the lab results as soon as they are available. The fastest way to get your results is to activate your My Chart account. Instructions are located on the last page of this paperwork. If you have not heard from us regarding the results in 2 weeks, please contact this office.

## 2016-02-02 ENCOUNTER — Ambulatory Visit: Payer: Self-pay | Admitting: Physician Assistant

## 2016-02-02 IMAGING — CR DG CERVICAL SPINE 2 OR 3 VIEWS
3 series · 3 of 3 positions shown · non-contrast
Comparison: None.

CLINICAL DATA: Chronic neck pain, no injury.

EXAM:
CERVICAL SPINE - 2-3 VIEW

[AP]
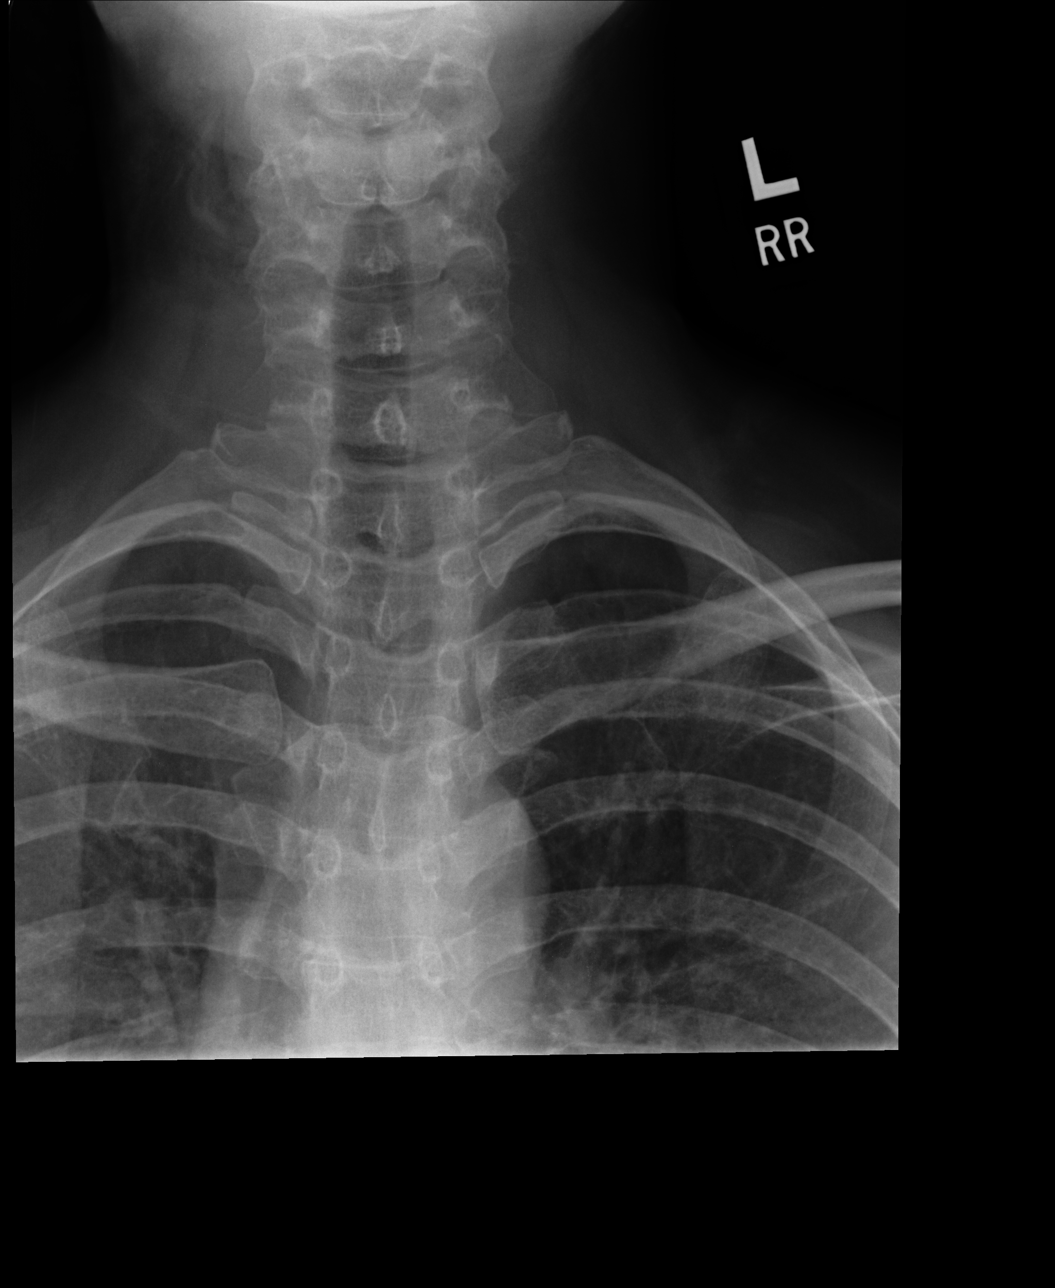

[lateral (1 of 2)]
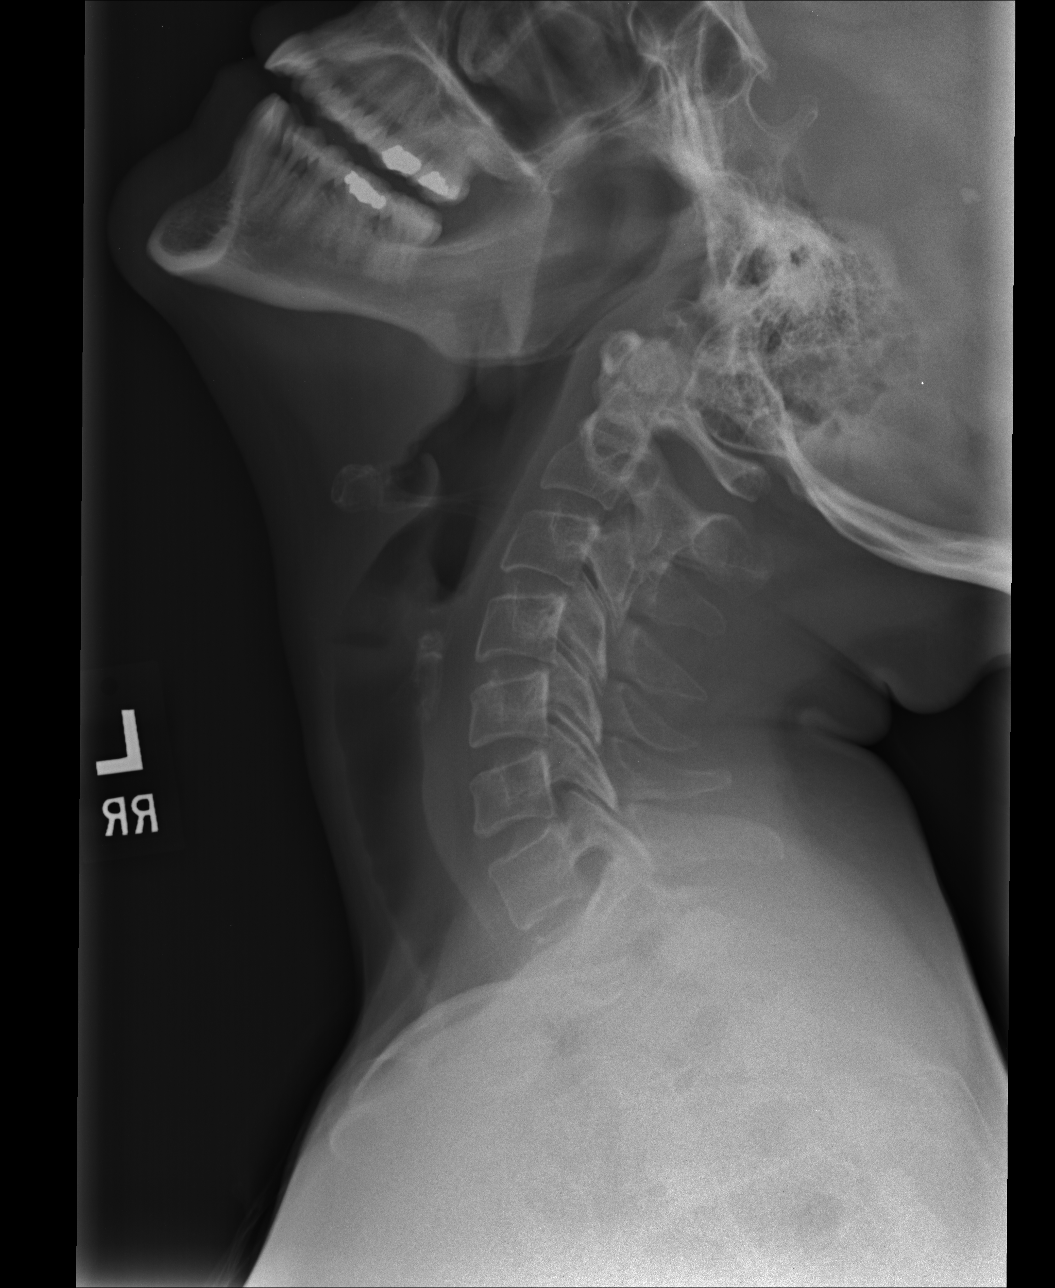

[lateral (2 of 2)]
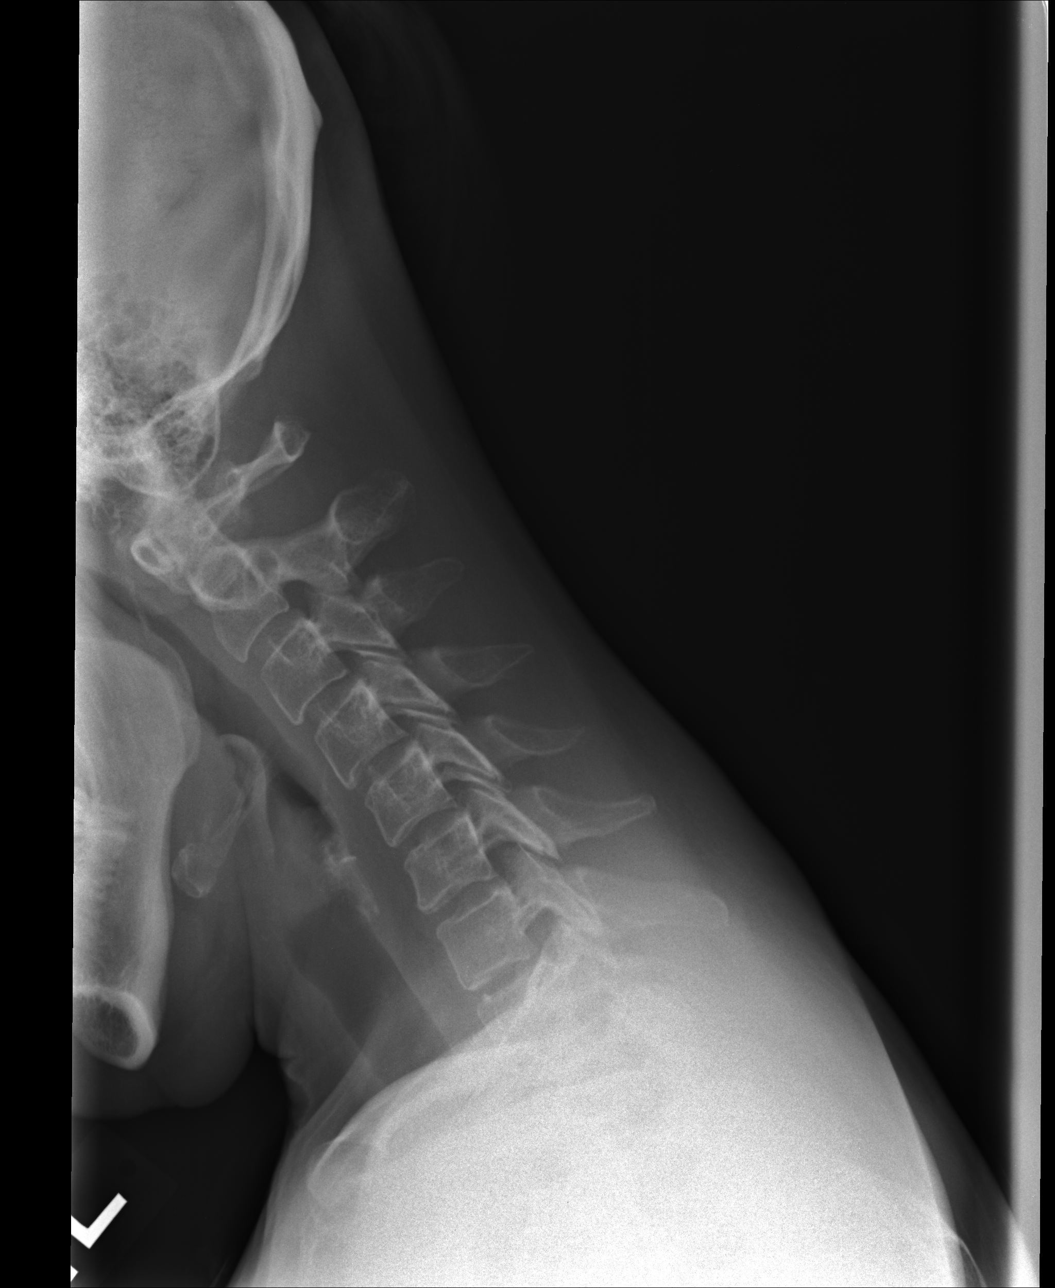

[3 of 3 positions shown; findings below may reference images not displayed]

FINDINGS: AP and lateral flexion/extension views are submitted. The cervical
spine is visualized from the occiput to the cervicothoracic
junction. Alignment is anatomic. Vertebral body and disc space
height are maintained. There [DATE] mm retrolisthesis of C4 on C5
with extension. Minimal uncovertebral hypertrophy and facet
sclerosis. Prevertebral soft tissues are within normal limits.
Visualized lung apices are clear.
IMPRESSION: Suspect minimal retrolisthesis of C4 on C5 with extension.

## 2016-02-16 ENCOUNTER — Other Ambulatory Visit: Payer: Self-pay | Admitting: Physician Assistant

## 2016-02-16 DIAGNOSIS — N94819 Vulvodynia, unspecified: Secondary | ICD-10-CM

## 2016-02-16 DIAGNOSIS — G8929 Other chronic pain: Secondary | ICD-10-CM

## 2016-02-16 DIAGNOSIS — M549 Dorsalgia, unspecified: Secondary | ICD-10-CM

## 2016-02-16 DIAGNOSIS — M792 Neuralgia and neuritis, unspecified: Secondary | ICD-10-CM

## 2016-02-17 ENCOUNTER — Other Ambulatory Visit: Payer: Self-pay | Admitting: Physician Assistant

## 2016-02-17 DIAGNOSIS — F32A Depression, unspecified: Secondary | ICD-10-CM

## 2016-02-17 DIAGNOSIS — G43009 Migraine without aura, not intractable, without status migrainosus: Secondary | ICD-10-CM

## 2016-02-17 DIAGNOSIS — M792 Neuralgia and neuritis, unspecified: Secondary | ICD-10-CM

## 2016-02-17 DIAGNOSIS — F329 Major depressive disorder, single episode, unspecified: Secondary | ICD-10-CM

## 2016-02-17 DIAGNOSIS — G8929 Other chronic pain: Secondary | ICD-10-CM

## 2016-02-17 DIAGNOSIS — G588 Other specified mononeuropathies: Secondary | ICD-10-CM

## 2016-02-17 DIAGNOSIS — N94819 Vulvodynia, unspecified: Secondary | ICD-10-CM

## 2016-02-17 DIAGNOSIS — M549 Dorsalgia, unspecified: Secondary | ICD-10-CM

## 2016-02-20 ENCOUNTER — Other Ambulatory Visit: Payer: Self-pay | Admitting: Physician Assistant

## 2016-02-21 ENCOUNTER — Other Ambulatory Visit: Payer: Self-pay | Admitting: Physician Assistant

## 2016-02-21 NOTE — Telephone Encounter (Signed)
Please print this Rx for the patient

## 2016-02-21 NOTE — Addendum Note (Signed)
Addended by: Morrell RiddleWEBER, SARAH L on: 02/21/2016 09:08 PM   Modules accepted: Orders

## 2016-02-22 MED ORDER — HYDROCODONE-ACETAMINOPHEN 10-325 MG PO TABS: 1.0000 | ORAL_TABLET | ORAL | Status: DC | PRN

## 2016-02-22 NOTE — Addendum Note (Signed)
Addended by: Fernande BrasJEFFERY, Mykela Mewborn S on: 02/22/2016 03:09 PM   Modules accepted: Orders

## 2016-02-22 NOTE — Telephone Encounter (Signed)
Meds ordered this encounter  Medications  . HYDROcodone-acetaminophen (NORCO) 10-325 MG tablet    Sig: Take 1 tablet by mouth every 4 (four) hours as needed for severe pain.    Dispense:  180 tablet    Refill:  0    Order Specific Question:  Supervising Provider    Answer:  DOOLITTLE, ROBERT P [[id number]]

## 2016-02-23 MED ORDER — DESIPRAMINE HCL 150 MG PO TABS: 150.0000 mg | ORAL_TABLET | Freq: Every day | ORAL | Status: DC

## 2016-02-23 NOTE — Addendum Note (Signed)
Addended by: Morrell RiddleWEBER, Kuba Shepherd L on: 02/23/2016 03:33 PM   Modules accepted: Orders

## 2016-02-23 NOTE — Telephone Encounter (Signed)
Notified pt on mychart that Rx is ready.

## 2016-02-28 ENCOUNTER — Encounter: Payer: Self-pay | Admitting: Physician Assistant

## 2016-03-06 MED ORDER — IMIPRAMINE HCL 50 MG PO TABS: 50.0000 mg | ORAL_TABLET | Freq: Every day | ORAL | Status: DC

## 2016-03-19 ENCOUNTER — Telehealth: Payer: Self-pay | Admitting: Physician Assistant

## 2016-03-19 DIAGNOSIS — M549 Dorsalgia, unspecified: Secondary | ICD-10-CM

## 2016-03-19 DIAGNOSIS — N94819 Vulvodynia, unspecified: Secondary | ICD-10-CM

## 2016-03-19 DIAGNOSIS — G8929 Other chronic pain: Secondary | ICD-10-CM

## 2016-03-19 DIAGNOSIS — M792 Neuralgia and neuritis, unspecified: Secondary | ICD-10-CM

## 2016-03-19 MED ORDER — CYCLOBENZAPRINE HCL 10 MG PO TABS: 10.0000 mg | ORAL_TABLET | Freq: Three times a day (TID) | ORAL | Status: DC | PRN

## 2016-03-19 MED ORDER — HYDROCODONE-ACETAMINOPHEN 10-325 MG PO TABS: 1.0000 | ORAL_TABLET | ORAL | Status: DC | PRN

## 2016-03-19 NOTE — Telephone Encounter (Signed)
Rx to be put in drawer at 102 

## 2016-03-19 NOTE — Telephone Encounter (Signed)
Done - pt knows through my chart 

## 2016-03-19 NOTE — Addendum Note (Signed)
Addended by: Morrell RiddleWEBER, Marytza Grandpre L on: 03/19/2016 12:17 PM   Modules accepted: Orders

## 2016-04-12 ENCOUNTER — Telehealth: Payer: Self-pay | Admitting: Physician Assistant

## 2016-04-12 DIAGNOSIS — M792 Neuralgia and neuritis, unspecified: Secondary | ICD-10-CM

## 2016-04-12 DIAGNOSIS — N94819 Vulvodynia, unspecified: Secondary | ICD-10-CM

## 2016-04-12 DIAGNOSIS — M549 Dorsalgia, unspecified: Secondary | ICD-10-CM

## 2016-04-12 DIAGNOSIS — G8929 Other chronic pain: Secondary | ICD-10-CM

## 2016-04-13 MED ORDER — HYDROCODONE-ACETAMINOPHEN 10-325 MG PO TABS: 1.0000 | ORAL_TABLET | ORAL | Status: DC | PRN

## 2016-04-13 MED ORDER — IMIPRAMINE HCL 50 MG PO TABS: 150.0000 mg | ORAL_TABLET | Freq: Every day | ORAL | Status: DC

## 2016-04-13 NOTE — Telephone Encounter (Signed)
Done - pt knows through my chart 

## 2016-04-13 NOTE — Addendum Note (Signed)
Addended by: Morrell RiddleWEBER, Inga Noller L on: 04/13/2016 08:29 AM   Modules accepted: Orders

## 2016-04-13 NOTE — Telephone Encounter (Signed)
Rx in drawer. 

## 2016-05-11 ENCOUNTER — Telehealth: Payer: Self-pay | Admitting: Physician Assistant

## 2016-05-11 DIAGNOSIS — N94819 Vulvodynia, unspecified: Secondary | ICD-10-CM

## 2016-05-11 DIAGNOSIS — G8929 Other chronic pain: Secondary | ICD-10-CM

## 2016-05-11 DIAGNOSIS — M549 Dorsalgia, unspecified: Secondary | ICD-10-CM

## 2016-05-11 DIAGNOSIS — M792 Neuralgia and neuritis, unspecified: Secondary | ICD-10-CM

## 2016-05-14 ENCOUNTER — Other Ambulatory Visit: Payer: Self-pay | Admitting: Physician Assistant

## 2016-05-16 ENCOUNTER — Other Ambulatory Visit: Payer: Self-pay | Admitting: Physician Assistant

## 2016-05-16 MED ORDER — HYDROCODONE-ACETAMINOPHEN 10-325 MG PO TABS: 1.0000 | ORAL_TABLET | ORAL | Status: DC | PRN

## 2016-05-16 NOTE — Addendum Note (Signed)
Addended by: Morrell RiddleWEBER, Essie Gehret L on: 05/16/2016 06:23 PM   Modules accepted: Orders

## 2016-05-16 NOTE — Telephone Encounter (Signed)
Done - pt knows through my chart 

## 2016-05-17 NOTE — Telephone Encounter (Signed)
Rx ready and pt notified on MyChart.

## 2016-05-17 NOTE — Telephone Encounter (Signed)
rx for Norco at front desk for pick up

## 2016-06-09 ENCOUNTER — Other Ambulatory Visit: Payer: Self-pay | Admitting: Physician Assistant

## 2016-06-09 DIAGNOSIS — M792 Neuralgia and neuritis, unspecified: Secondary | ICD-10-CM

## 2016-06-09 DIAGNOSIS — N94819 Vulvodynia, unspecified: Secondary | ICD-10-CM

## 2016-06-09 DIAGNOSIS — M549 Dorsalgia, unspecified: Secondary | ICD-10-CM

## 2016-06-09 DIAGNOSIS — G8929 Other chronic pain: Secondary | ICD-10-CM

## 2016-06-09 DIAGNOSIS — R102 Pelvic and perineal pain: Secondary | ICD-10-CM

## 2016-06-11 MED ORDER — HYDROCODONE-ACETAMINOPHEN 10-325 MG PO TABS: 1.0000 | ORAL_TABLET | ORAL | 0 refills | Status: DC | PRN

## 2016-06-11 MED ORDER — LIDOCAINE (ANORECTAL) 5 % EX GEL: 1.0000 "application " | Freq: Three times a day (TID) | CUTANEOUS | 12 refills | Status: AC

## 2016-06-11 NOTE — Telephone Encounter (Signed)
Rx in drawer. 

## 2016-06-11 NOTE — Telephone Encounter (Signed)
Done - pt knows through my chart 

## 2016-07-07 ENCOUNTER — Ambulatory Visit (INDEPENDENT_AMBULATORY_CARE_PROVIDER_SITE_OTHER): Payer: Commercial Managed Care - HMO | Admitting: Physician Assistant

## 2016-07-07 VITALS — BP 110/70 | HR 91 | Temp 98.1°F | Resp 18 | Ht 66.0 in | Wt 141.5 lb

## 2016-07-07 DIAGNOSIS — N94819 Vulvodynia, unspecified: Secondary | ICD-10-CM | POA: Diagnosis not present

## 2016-07-07 DIAGNOSIS — Z23 Encounter for immunization: Secondary | ICD-10-CM

## 2016-07-07 DIAGNOSIS — M792 Neuralgia and neuritis, unspecified: Secondary | ICD-10-CM | POA: Diagnosis not present

## 2016-07-07 DIAGNOSIS — G894 Chronic pain syndrome: Secondary | ICD-10-CM

## 2016-07-07 DIAGNOSIS — M549 Dorsalgia, unspecified: Secondary | ICD-10-CM

## 2016-07-07 DIAGNOSIS — G8929 Other chronic pain: Secondary | ICD-10-CM

## 2016-07-07 MED ORDER — HYDROCODONE-ACETAMINOPHEN 10-325 MG PO TABS: 1.0000 | ORAL_TABLET | ORAL | 0 refills | Status: DC

## 2016-07-07 MED ORDER — IMIPRAMINE HCL 50 MG PO TABS: 300.0000 mg | ORAL_TABLET | Freq: Every day | ORAL | 0 refills | Status: DC

## 2016-07-07 MED ORDER — POLYETHYLENE GLYCOL 3350 17 GM/SCOOP PO POWD: 17.0000 g | Freq: Every day | ORAL | 1 refills | Status: DC

## 2016-07-07 NOTE — Progress Notes (Signed)
Jennifer French  MRN: [id number] DOB: October 13, 1971  Subjective:  Pt presents to clinic for medication refill.  She has been doing ok.  She has gained weight but she hurts all the time and she is ok with the weight gain.  She cannot wear tight pants now underwear due to pain.  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  She has stopped smoking marijuana because she did not have a source that she trusted and she never really felt comfortable smoking it and she does not think that it helped her pain that much but was able to think about her pain differently after she smoked.  She is really depressed and she cries every day but she is not suicidal and she is dealing with it.  She has been having a lot of pain with passing stools - some days she holds it because she knows it is going to hurt and then the next day it hurts more and she knows this.  She has small long formed stools and only has pain during the passing of the stool within minutes of the stool passing her pain has resolved.  The imipramine helps with her migraines in fact she has had not had one since starting medication but it does not help her depression like the desipramine did - she is hoping with her new insurance that this medication will be covered and she can switch back.      Opioid Risk Tool - 07/07/16 1400      Family History of Substance Abuse   Alcohol Positive Female   Illegal Drugs Negative   Rx Drugs Negative     Personal History of Substance Abuse   Alcohol Negative   Illegal Drugs Negative   Rx Drugs Negative     Age   Age between 9816-45 years  Yes     History of Preadolescent Sexual Abuse   History of Preadolescent Sexual Abuse Negative or Female     Psychological Disease   Psychological Disease Negative   Depression Positive     Total Score   Total Score 3     Low risk [place] drug database -- consistent with use and Rx   Review of Systems  Psychiatric/Behavioral: Positive for dysphoric mood.  Negative for self-injury, sleep disturbance and suicidal ideas.    Patient Active Problem List   Diagnosis Date Noted  . Chronic pain syndrome 07/07/2016  . Depression 12/24/2015  . Pudendal neuralgia 03/04/2015  . Nerve pain 12/28/2014  . Vulvodynia 09/01/2013  . Chronic back pain 08/20/2013  . Migraines 12/01/2012  . IBS (irritable bowel syndrome) 12/01/2012  . SMOKER 08/17/2009  . NEPHROLITHIASIS 08/17/2009    Current Outpatient Prescriptions on File Prior to Visit  Medication Sig Dispense Refill  . cyclobenzaprine (FLEXERIL) 10 MG tablet Take 1 tablet (10 mg total) by mouth 3 (three) times daily as needed for muscle spasms. 90 tablet 3  . Lidocaine, Anorectal, 5 % GEL Apply 1 application topically 3 (three) times daily. 30 g 12  . desipramine (NOPRAMIN) 150 MG tablet Take 1 tablet (150 mg total) by mouth at bedtime. (Patient not taking: Reported on 07/07/2016) 90 tablet 1  . naratriptan (AMERGE) 1 MG TABS tablet TAKE 1 TABLET BY MOUTH AT ONSET OF HEADACHE. MAY REPEAT AFTER 4 HOURS AS NEEDED. DO NOT EXCEED 5 MG IN 24 HOURS. (Patient not taking: Reported on 12/24/2015) 4 tablet 4   No current facility-administered medications on file prior  to visit.     No Known Allergies  Pt patients past, family and social history were reviewed and updated.  Objective:  BP 110/70   Pulse 91   Temp 98.1 F (36.7 C) (Oral)   Resp 18   Ht 5\' 6"  (1.676 m)   Wt 141 lb 8 oz (64.2 kg)   LMP 06/15/2016 (Exact Date)   SpO2 100%   BMI 22.84 kg/m   Physical Exam  Constitutional: She is oriented to person, place, and time and well-developed, well-nourished, and in no distress.  HENT:  Head: Normocephalic and atraumatic.  Right Ear: Hearing and external ear normal.  Left Ear: Hearing and external ear normal.  Eyes: Conjunctivae are normal.  Neck: Normal range of motion.  Pulmonary/Chest: Effort normal.  Neurological: She is alert and oriented to person, place, and time. Gait normal.  Skin:  Skin is warm and dry.  Psychiatric: Mood, memory, affect and judgment normal.  Tearful but good attitude  Vitals reviewed.   Assessment and Plan :  Need for Tdap vaccination - Plan: Tdap vaccine greater than or equal to 7yo IM  Chronic back pain - Plan: imipramine (TOFRANIL) 50 MG tablet, -- increase the dose but 50mg  a week until her depression is better or she has side effects or she reaches 300mg  daily -- will consider switch back to desipramine when her insurance changed--  HYDROcodone-acetaminophen (NORCO) 10-325 MG tablet  Vulvodynia - Plan: polyethylene glycol powder (GLYCOLAX/MIRALAX) powder, HYDROcodone-acetaminophen (NORCO) 10-325 MG tablet, HYDROcodone-acetaminophen (NORCO) 10-325 MG tablet, HYDROcodone-acetaminophen (NORCO) 10-325 MG tablet, Pain Mgmt, Prof 8 Conf w/o mM, U  Nerve pain - Plan: HYDROcodone-acetaminophen (NORCO) 10-325 MG tablet, Pain Mgmt, Prof 8 Conf w/o mM, U  Chronic pain syndrome - Plan: Pain Mgmt, Prof 8 Conf w/o mM, U - pain contract signed -- will see the patient every 3 months - Rx written until her f/u  Flu vaccine need - Plan: Flu Vaccine QUAD 36+ mos IM  [name] PA-C  [facility] 07/07/2016 9:24 PM

## 2016-07-07 NOTE — Patient Instructions (Addendum)
Immunization History  Administered Date(s) Administered  . Influenza,inj,Quad PF,36+ Mos 07/07/2016  . Influenza-Unspecified 11/12/2010, 08/27/2013  . Tdap 11/12/2004, 07/07/2016       IF you received an x-ray today, you will receive an invoice from [facility]. Please contact [facility] at [phone] with questions or concerns regarding your invoice.   IF you received labwork today, you will receive an invoice from [facility]. Please contact [facility] at [phone] with questions or concerns regarding your invoice.   Our billing staff will not be able to assist you with questions regarding bills from these companies.  You will be contacted with the lab results as soon as they are available. The fastest way to get your results is to activate your My Chart account. Instructions are located on the last page of this paperwork. If you have not heard from us regarding the results in 2 weeks, please contact this office.

## 2016-07-14 LAB — PAIN MGMT, PROF 8 CONF W/O MM, U
6 ACETYLMORPHINE: NEGATIVE ng/mL (ref <10)
AMPHETAMINES: NEGATIVE ng/mL (ref <500)
Alcohol Metabolites: NEGATIVE ng/mL (ref <500)
BENZODIAZEPINES: NEGATIVE ng/mL (ref <100)
Buprenorphine: NEGATIVE ng/mL (ref <5)
CODEINE: NEGATIVE ng/mL (ref <50)
Cocaine Metabolite: NEGATIVE ng/mL (ref <150)
Creatinine: 50.7 mg/dL (ref >=20.0)
HYDROMORPHONE: 404 ng/mL — AB (ref <50)
Hydrocodone: 1931 ng/mL — ABNORMAL HIGH (ref <50)
MARIJUANA METABOLITE: NEGATIVE ng/mL (ref <20)
MDMA: NEGATIVE ng/mL (ref <500)
MORPHINE: NEGATIVE ng/mL (ref <50)
NORHYDROCODONE: 3892 ng/mL — AB (ref <50)
NOROXYCODONE: 1098 ng/mL — AB (ref <50)
OXIDANT: NEGATIVE ug/mL (ref <200)
OXYCODONE: 822 ng/mL — AB (ref <50)
OXYCODONE: POSITIVE ng/mL — AB (ref <100)
Opiates: POSITIVE ng/mL — AB (ref <100)
Oxymorphone: 413 ng/mL — ABNORMAL HIGH (ref <50)
PLEASE NOTE: 0
pH: 6.12 (ref 4.5–9.0)

## 2016-07-19 ENCOUNTER — Other Ambulatory Visit: Payer: Self-pay | Admitting: Physician Assistant

## 2016-07-19 DIAGNOSIS — G8929 Other chronic pain: Secondary | ICD-10-CM

## 2016-07-19 DIAGNOSIS — M549 Dorsalgia, unspecified: Principal | ICD-10-CM

## 2016-08-06 ENCOUNTER — Other Ambulatory Visit: Payer: Self-pay | Admitting: Physician Assistant

## 2016-09-04 ENCOUNTER — Other Ambulatory Visit: Payer: Self-pay | Admitting: Physician Assistant

## 2016-09-27 ENCOUNTER — Ambulatory Visit (INDEPENDENT_AMBULATORY_CARE_PROVIDER_SITE_OTHER): Payer: Commercial Managed Care - HMO | Admitting: Physician Assistant

## 2016-09-27 DIAGNOSIS — M792 Neuralgia and neuritis, unspecified: Secondary | ICD-10-CM

## 2016-09-27 DIAGNOSIS — N94819 Vulvodynia, unspecified: Secondary | ICD-10-CM | POA: Diagnosis not present

## 2016-09-27 MED ORDER — HYDROCODONE-ACETAMINOPHEN 10-325 MG PO TABS: 1.0000 | ORAL_TABLET | ORAL | 0 refills | Status: DC

## 2016-09-27 MED ORDER — IMIPRAMINE HCL 50 MG PO TABS: 150.0000 mg | ORAL_TABLET | Freq: Every day | ORAL | 1 refills | Status: DC

## 2016-09-27 NOTE — Progress Notes (Signed)
Laurence AlyHeather Coulthard  MRN: [id number] DOB: 02-Jan-1971  Subjective:  Pt presents to clinic for medication refills.  She has been doing well.  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  She is getting new insurance at the end of December so she is hoping that she will be able to go back to the desipramine which helped her pain and her depression.  She tried to increase the imipramine but it made her really nauseated and she had to decrease the dose back down.  She is looking forward to maybe going back to the medication that worked better for her.  Her pain has not gotten worse but she still has pain daily but she is able to function on her current pain medication regimen.  Review of Systems  Patient Active Problem List   Diagnosis Date Noted  . Chronic pain syndrome 07/07/2016  . Depression 12/24/2015  . Pudendal neuralgia 03/04/2015  . Nerve pain 12/28/2014  . Vulvodynia 09/01/2013  . Chronic back pain 08/20/2013  . Migraines 12/01/2012  . IBS (irritable bowel syndrome) 12/01/2012  . SMOKER 08/17/2009  . NEPHROLITHIASIS 08/17/2009    Current Outpatient Prescriptions on File Prior to Visit  Medication Sig Dispense Refill  . cyclobenzaprine (FLEXERIL) 10 MG tablet TAKE ONE TABLET BY MOUTH THREE TIMES DAILY AS NEEDED FOR MUSCLE SPASM 90 tablet 3  . Lidocaine, Anorectal, 5 % GEL Apply 1 application topically 3 (three) times daily. 30 g 12  . polyethylene glycol powder (GLYCOLAX/MIRALAX) powder Take 17 g by mouth daily. 500 g 1  . desipramine (NOPRAMIN) 150 MG tablet Take 1 tablet (150 mg total) by mouth at bedtime. (Patient not taking: Reported on 09/27/2016) 90 tablet 1  . naratriptan (AMERGE) 1 MG TABS tablet TAKE 1 TABLET BY MOUTH AT ONSET OF HEADACHE. MAY REPEAT AFTER 4 HOURS AS NEEDED. DO NOT EXCEED 5 MG IN 24 HOURS. (Patient not taking: Reported on  09/27/2016) 4 tablet 4   No current facility-administered medications on file prior to visit.     No Known Allergies  Pt patients past, family and social history were reviewed and updated.   Objective:  BP 110/68 (BP Location: Right Arm, Patient Position: Sitting, Cuff Size: Normal)   Pulse 94   Temp 98.7 F (37.1 C) (Oral)   Resp 16   Ht 5\' 5"  (1.651 m)   Wt 140 lb 6.4 oz (63.7 kg)   SpO2 100%   BMI 23.36 kg/m   Physical Exam  Constitutional: She is oriented to person, place, and time and well-developed, well-nourished, and in no distress.  HENT:  Head: Normocephalic and atraumatic.  Right Ear: Hearing and external ear normal.  Left Ear: Hearing and external ear normal.  Eyes: Conjunctivae are normal.  Neck: Normal range of motion.  Pulmonary/Chest: Effort normal.  Neurological: She is alert and oriented to person, place, and time. Gait normal.  Skin: Skin is warm and dry.  Psychiatric: Mood, memory, affect and judgment normal.  Vitals reviewed.   Assessment and Plan :  Vulvodynia - Plan: imipramine (TOFRANIL) 50 MG tablet, HYDROcodone-acetaminophen (NORCO) 10-325 MG tablet, HYDROcodone-acetaminophen (NORCO) 10-325 MG tablet, HYDROcodone-acetaminophen (NORCO) 10-325 MG tablet  Nerve pain - Plan: imipramine (TOFRANIL) 50 MG tablet, HYDROcodone-acetaminophen (NORCO) 10-325 MG tablet  Benny LennertSarah Twala Collings PA-C  [facility] 09/27/2016  6:05 PM

## 2016-09-27 NOTE — Patient Instructions (Signed)
     IF you received an x-ray today, you will receive an invoice from [facility]. Please contact [facility] at [phone] with questions or concerns regarding your invoice.   IF you received labwork today, you will receive an invoice from [facility]. Please contact [facility] at [phone] with questions or concerns regarding your invoice.   Our billing staff will not be able to assist you with questions regarding bills from these companies.  You will be contacted with the lab results as soon as they are available. The fastest way to get your results is to activate your My Chart account. Instructions are located on the last page of this paperwork. If you have not heard from us regarding the results in 2 weeks, please contact this office.      

## 2016-12-20 ENCOUNTER — Encounter: Payer: Self-pay | Admitting: Physician Assistant

## 2016-12-20 ENCOUNTER — Ambulatory Visit (INDEPENDENT_AMBULATORY_CARE_PROVIDER_SITE_OTHER): Payer: Commercial Managed Care - HMO | Admitting: Physician Assistant

## 2016-12-20 VITALS — BP 120/74 | HR 90 | Temp 99.1°F | Resp 18 | Ht 65.0 in | Wt 138.0 lb

## 2016-12-20 DIAGNOSIS — I7300 Raynaud's syndrome without gangrene: Secondary | ICD-10-CM | POA: Diagnosis not present

## 2016-12-20 DIAGNOSIS — M792 Neuralgia and neuritis, unspecified: Secondary | ICD-10-CM | POA: Diagnosis not present

## 2016-12-20 DIAGNOSIS — Z1329 Encounter for screening for other suspected endocrine disorder: Secondary | ICD-10-CM | POA: Diagnosis not present

## 2016-12-20 DIAGNOSIS — Z13220 Encounter for screening for lipoid disorders: Secondary | ICD-10-CM | POA: Diagnosis not present

## 2016-12-20 DIAGNOSIS — Z13228 Encounter for screening for other metabolic disorders: Secondary | ICD-10-CM | POA: Diagnosis not present

## 2016-12-20 DIAGNOSIS — N94819 Vulvodynia, unspecified: Secondary | ICD-10-CM

## 2016-12-20 DIAGNOSIS — R5383 Other fatigue: Secondary | ICD-10-CM | POA: Diagnosis not present

## 2016-12-20 DIAGNOSIS — Z130 Encounter for screening for diseases of the blood and blood-forming organs and certain disorders involving the immune mechanism: Secondary | ICD-10-CM | POA: Diagnosis not present

## 2016-12-20 DIAGNOSIS — Z114 Encounter for screening for human immunodeficiency virus [HIV]: Secondary | ICD-10-CM | POA: Diagnosis not present

## 2016-12-20 MED ORDER — HYDROCODONE-ACETAMINOPHEN 10-325 MG PO TABS: 1.0000 | ORAL_TABLET | ORAL | 0 refills | Status: DC

## 2016-12-20 NOTE — Progress Notes (Signed)
Jennifer French  MRN: [id number] DOB: May 23, 1971  Subjective:  Pt presents to clinic for medication refills.  Her current dose of medication is doing well.  She has started to develop symptoms of Raynauds that is improved with heating of her fingers and friction. Finger tip and toes get white and cold and hurt when that happens but once they warm up they return to red quickly.  She is still smoking but she knows that she wants to quit but she is also realistic and knows that she is not going to.  Review of Systems  Constitutional: Positive for fatigue (overall all body fatigue). Negative for chills and fever.  Psychiatric/Behavioral: Positive for dysphoric mood (but doing ok with her medications). The patient is nervous/anxious.     Patient Active Problem List   Diagnosis Date Noted  . Chronic pain syndrome 07/07/2016  . Depression 12/24/2015  . Pudendal neuralgia 03/04/2015  . Nerve pain 12/28/2014  . Vulvodynia 09/01/2013  . Chronic back pain 08/20/2013  . Migraines 12/01/2012  . IBS (irritable bowel syndrome) 12/01/2012  . SMOKER 08/17/2009  . NEPHROLITHIASIS 08/17/2009    Current Outpatient Prescriptions on File Prior to Visit  Medication Sig Dispense Refill  . cyclobenzaprine (FLEXERIL) 10 MG tablet TAKE ONE TABLET BY MOUTH THREE TIMES DAILY AS NEEDED FOR MUSCLE SPASM 90 tablet 3  . imipramine (TOFRANIL) 50 MG tablet Take 3 tablets (150 mg total) by mouth at bedtime. 180 tablet 1  . Lidocaine, Anorectal, 5 % GEL Apply 1 application topically 3 (three) times daily. 30 g 12  . naratriptan (AMERGE) 1 MG TABS tablet TAKE 1 TABLET BY MOUTH AT ONSET OF HEADACHE. MAY REPEAT AFTER 4 HOURS AS NEEDED. DO NOT EXCEED 5 MG IN 24 HOURS. (Patient not taking: Reported on 12/20/2016) 4 tablet 4   No current facility-administered medications on file prior to visit.     No Known Allergies  Pt patients past, family and social history were reviewed and updated.   Objective:  BP 120/74  (BP Location: Right Arm, Patient Position: Sitting, Cuff Size: Small)   Pulse 90   Temp 99.1 F (37.3 C) (Oral)   Resp 18   Ht '5\' 5"'  (1.651 m)   Wt 138 lb (62.6 kg)   LMP 12/13/2016   SpO2 98%   BMI 22.96 kg/m   Physical Exam  Constitutional: She is oriented to person, place, and time and well-developed, well-nourished, and in no distress.  HENT:  Head: Normocephalic and atraumatic.  Right Ear: Hearing and external ear normal.  Left Ear: Hearing and external ear normal.  Eyes: Conjunctivae are normal.  Neck: Normal range of motion.  Pulmonary/Chest: Effort normal.  Neurological: She is alert and oriented to person, place, and time. Gait normal.  Skin: Skin is warm and dry.  Psychiatric: Mood, memory, affect and judgment normal.  Vitals reviewed.   Assessment and Plan :  Nerve pain  Vulvodynia - Plan: HYDROcodone-acetaminophen (NORCO) 10-325 MG tablet, HYDROcodone-acetaminophen (NORCO) 10-325 MG tablet, HYDROcodone-acetaminophen (NORCO) 10-325 MG tablet  Encounter for screening for HIV - Plan: HIV antibody  Screening for thyroid disorder - Plan: TSH  Screening for metabolic disorder - Plan: CMP14+EGFR  Screening, lipid - Plan: Lipid panel  Screening for HIV (human immunodeficiency virus)  Screening, anemia, deficiency, iron - Plan: CBC with Differential/Platelet  Other fatigue  Raynaud's disease without gangrene - continue to monitor - as long as the color returns to fingers she does not need further evaluation - d/w pt that she  needs to keep tips of fingers warm and watch for lesions and seek evaluation should they develop.  Windell Hummingbird PA-C  [facility] 12/20/2016 5:03 PM

## 2016-12-20 NOTE — Patient Instructions (Signed)
     IF you received an x-ray today, you will receive an invoice from [facility]. Please contact [facility] at [phone] with questions or concerns regarding your invoice.   IF you received labwork today, you will receive an invoice from [facility]. Please contact [facility] at [phone] with questions or concerns regarding your invoice.   Our billing staff will not be able to assist you with questions regarding bills from these companies.  You will be contacted with the lab results as soon as they are available. The fastest way to get your results is to activate your My Chart account. Instructions are located on the last page of this paperwork. If you have not heard from us regarding the results in 2 weeks, please contact this office.     

## 2016-12-21 LAB — CBC WITH DIFFERENTIAL/PLATELET
BASOS: 0 %
Basophils Absolute: 0 10*3/uL (ref 0.0–0.2)
EOS (ABSOLUTE): 0.2 10*3/uL (ref 0.0–0.4)
EOS: 3 %
HEMATOCRIT: 41.5 % (ref 34.0–46.6)
HEMOGLOBIN: 13.6 g/dL (ref 11.1–15.9)
IMMATURE GRANULOCYTES: 0 %
Immature Grans (Abs): 0 10*3/uL (ref 0.0–0.1)
LYMPHS ABS: 5.1 10*3/uL — AB (ref 0.7–3.1)
Lymphs: 56 %
MCH: 32.2 pg (ref 26.6–33.0)
MCHC: 32.8 g/dL (ref 31.5–35.7)
MCV: 98 fL — AB (ref 79–97)
MONOCYTES: 7 %
Monocytes Absolute: 0.6 10*3/uL (ref 0.1–0.9)
Neutrophils Absolute: 3.1 10*3/uL (ref 1.4–7.0)
Neutrophils: 34 %
Platelets: 272 10*3/uL (ref 150–379)
RBC: 4.23 x10E6/uL (ref 3.77–5.28)
RDW: 13.3 % (ref 12.3–15.4)
WBC: 9.2 10*3/uL (ref 3.4–10.8)

## 2016-12-21 LAB — CMP14+EGFR
ALK PHOS: 46 IU/L (ref 39–117)
ALT: 12 IU/L (ref 0–32)
AST: 12 IU/L (ref 0–40)
Albumin/Globulin Ratio: 2.3 — ABNORMAL HIGH (ref 1.2–2.2)
Albumin: 4.6 g/dL (ref 3.5–5.5)
BUN/Creatinine Ratio: 17 (ref 9–23)
BUN: 12 mg/dL (ref 6–24)
Bilirubin Total: <0.2 mg/dL (ref 0.0–1.2)
CALCIUM: 9.4 mg/dL (ref 8.7–10.2)
CHLORIDE: 102 mmol/L (ref 96–106)
CO2: 25 mmol/L (ref 18–29)
Creatinine, Ser: 0.69 mg/dL (ref 0.57–1.00)
GFR calc Af Amer: 123 mL/min/{1.73_m2} (ref >59)
GFR calc non Af Amer: 107 mL/min/{1.73_m2} (ref >59)
GLOBULIN, TOTAL: 2 g/dL (ref 1.5–4.5)
Glucose: 95 mg/dL (ref 65–99)
POTASSIUM: 4.7 mmol/L (ref 3.5–5.2)
Sodium: 140 mmol/L (ref 134–144)
Total Protein: 6.6 g/dL (ref 6.0–8.5)

## 2016-12-21 LAB — LIPID PANEL
CHOLESTEROL TOTAL: 171 mg/dL (ref 100–199)
Chol/HDL Ratio: 3.6 ratio units (ref 0.0–4.4)
HDL: 48 mg/dL (ref >39)
LDL Calculated: 102 mg/dL — ABNORMAL HIGH (ref 0–99)
TRIGLYCERIDES: 106 mg/dL (ref 0–149)
VLDL CHOLESTEROL CAL: 21 mg/dL (ref 5–40)

## 2016-12-21 LAB — HIV ANTIBODY (ROUTINE TESTING W REFLEX): HIV Screen 4th Generation wRfx: NONREACTIVE

## 2016-12-21 LAB — TSH: TSH: 1.76 u[IU]/mL (ref 0.450–4.500)

## 2016-12-26 ENCOUNTER — Other Ambulatory Visit: Payer: Self-pay

## 2016-12-26 NOTE — Telephone Encounter (Signed)
Got message from [facility] they gave 139 norco to pt today (partial rx) because that's all they had in stock.

## 2017-01-19 ENCOUNTER — Other Ambulatory Visit: Payer: Self-pay | Admitting: Physician Assistant

## 2017-01-19 DIAGNOSIS — M549 Dorsalgia, unspecified: Principal | ICD-10-CM

## 2017-01-19 DIAGNOSIS — G8929 Other chronic pain: Secondary | ICD-10-CM

## 2017-03-03 ENCOUNTER — Encounter: Payer: Self-pay | Admitting: Physician Assistant

## 2017-03-08 ENCOUNTER — Ambulatory Visit (INDEPENDENT_AMBULATORY_CARE_PROVIDER_SITE_OTHER): Payer: Commercial Managed Care - HMO | Admitting: Physician Assistant

## 2017-03-08 ENCOUNTER — Encounter: Payer: Self-pay | Admitting: Physician Assistant

## 2017-03-08 VITALS — BP 123/72 | HR 84 | Temp 98.9°F | Resp 17 | Ht 65.0 in | Wt 130.0 lb

## 2017-03-08 DIAGNOSIS — Z13220 Encounter for screening for lipoid disorders: Secondary | ICD-10-CM | POA: Diagnosis not present

## 2017-03-08 DIAGNOSIS — G894 Chronic pain syndrome: Secondary | ICD-10-CM | POA: Diagnosis not present

## 2017-03-08 DIAGNOSIS — M792 Neuralgia and neuritis, unspecified: Secondary | ICD-10-CM | POA: Diagnosis not present

## 2017-03-08 DIAGNOSIS — F329 Major depressive disorder, single episode, unspecified: Secondary | ICD-10-CM | POA: Diagnosis not present

## 2017-03-08 DIAGNOSIS — N94819 Vulvodynia, unspecified: Secondary | ICD-10-CM | POA: Diagnosis not present

## 2017-03-08 DIAGNOSIS — B029 Zoster without complications: Secondary | ICD-10-CM | POA: Diagnosis not present

## 2017-03-08 DIAGNOSIS — B001 Herpesviral vesicular dermatitis: Secondary | ICD-10-CM | POA: Diagnosis not present

## 2017-03-08 DIAGNOSIS — B002 Herpesviral gingivostomatitis and pharyngotonsillitis: Secondary | ICD-10-CM

## 2017-03-08 DIAGNOSIS — G43009 Migraine without aura, not intractable, without status migrainosus: Secondary | ICD-10-CM | POA: Diagnosis not present

## 2017-03-08 DIAGNOSIS — G588 Other specified mononeuropathies: Secondary | ICD-10-CM | POA: Diagnosis not present

## 2017-03-08 MED ORDER — CYCLOBENZAPRINE HCL 10 MG PO TABS: 10.0000 mg | ORAL_TABLET | Freq: Three times a day (TID) | ORAL | 3 refills | Status: DC | PRN

## 2017-03-08 MED ORDER — HYDROCODONE-ACETAMINOPHEN 10-325 MG PO TABS: 1.0000 | ORAL_TABLET | ORAL | 0 refills | Status: DC

## 2017-03-08 MED ORDER — VALACYCLOVIR HCL 1 G PO TABS: ORAL_TABLET | ORAL | 0 refills | Status: DC

## 2017-03-08 MED ORDER — IMIPRAMINE HCL 50 MG PO TABS: 150.0000 mg | ORAL_TABLET | Freq: Every day | ORAL | 1 refills | Status: DC

## 2017-03-08 NOTE — Patient Instructions (Signed)
     IF you received an x-ray today, you will receive an invoice from [facility]. Please contact [facility] at [phone] with questions or concerns regarding your invoice.   IF you received labwork today, you will receive an invoice from [facility]. Please contact [facility] at [phone] with questions or concerns regarding your invoice.   Our billing staff will not be able to assist you with questions regarding bills from these companies.  You will be contacted with the lab results as soon as they are available. The fastest way to get your results is to activate your My Chart account. Instructions are located on the last page of this paperwork. If you have not heard from us regarding the results in 2 weeks, please contact this office.     

## 2017-03-08 NOTE — Progress Notes (Signed)
Jennifer French  MRN: [id number] DOB: 04/15/1971  PCP: Virgilio Belling  Chief Complaint  Patient presents with  . Medication Refill    Subjective:  Pt presents to clinic for medication refills.   Chronic pain medications are doing well - she still has pain but she is able to function with her level of pain.  Her imipramine is working well for her depression and she has not had a migraine since she started on this medication.  She has had vaginal bleeding for the last 10 weeks - on the times that she should be on her menses it is heavier but the other times she has steady spotting - she wonders if she is going through menopause - she has had a tubal and was no OCP prior to that since she was 46 y/o.   Thinks she might be getting shingles - she has a numb feeling on her right shoulder that she has had for the last week or so -- this is the exact feeling when she had shingles in the past - she is just getting over a fever blister outbreak on her lips.   Review of Systems  Genitourinary: Positive for menstrual problem and vaginal pain.  Skin: Negative for rash.    Patient Active Problem List   Diagnosis Date Noted  . Primary HSV infection of mouth 03/08/2017  . Chronic pain syndrome 07/07/2016  . Depression 12/24/2015  . Pudendal neuralgia 03/04/2015  . Nerve pain 12/28/2014  . Vulvodynia 09/01/2013  . Chronic back pain 08/20/2013  . Migraines 12/01/2012  . IBS (irritable bowel syndrome) 12/01/2012  . SMOKER 08/17/2009  . NEPHROLITHIASIS 08/17/2009    Current Outpatient Prescriptions on File Prior to Visit  Medication Sig Dispense Refill  . Lidocaine, Anorectal, 5 % GEL Apply 1 application topically 3 (three) times daily. 30 g 12   No current facility-administered medications on file prior to visit.     No Known Allergies  Pt patients past, family and social history were reviewed and updated.   Objective:  BP 123/72 (BP Location: Right Arm, Patient Position:  Sitting, Cuff Size: Normal)   Pulse 84   Temp 98.9 F (37.2 C) (Oral)   Resp 17   Ht  (1.651 m)   Wt 130 lb (59 kg)   LMP 02/25/2017   SpO2 99%   BMI 21.63 kg/m   Physical Exam  Constitutional: She is oriented to person, place, and time and well-developed, well-nourished, and in no distress.  HENT:  Head: Normocephalic and atraumatic.  Right Ear: Hearing and external ear normal.  Left Ear: Hearing and external ear normal.  Eyes: Conjunctivae are normal.  Neck: Normal range of motion.  Pulmonary/Chest: Effort normal.  Neurological: She is alert and oriented to person, place, and time. Gait normal.  Skin: Skin is warm and dry. No rash noted.  Change in sensation on the right in dermatome C5-7  Psychiatric: Mood, memory, affect and judgment normal.  Vitals reviewed.   Assessment and Plan :   Problem List Items Addressed This Visit      Cardiovascular and Mediastinum   Migraines    Controlled with imipramine.      Relevant Medications   HYDROcodone-acetaminophen (NORCO) 10-325 MG tablet   HYDROcodone-acetaminophen (NORCO) 10-325 MG tablet   HYDROcodone-acetaminophen (NORCO) 10-325 MG tablet   cyclobenzaprine (FLEXERIL) 10 MG tablet   imipramine (TOFRANIL) 50 MG tablet     Digestive   Primary HSV infection of mouth    Refilled  medications today.  Sues Valtrex prn.      Relevant Medications   valACYclovir (VALTREX) 1000 MG tablet     Nervous and Auditory   Nerve pain   Relevant Medications   cyclobenzaprine (FLEXERIL) 10 MG tablet   imipramine (TOFRANIL) 50 MG tablet   Pudendal neuralgia    Controlled on chronic pain management      Relevant Medications   cyclobenzaprine (FLEXERIL) 10 MG tablet   imipramine (TOFRANIL) 50 MG tablet     Other   Chronic pain syndrome    Well controlled.  Continue q3 months visits      Depression    Controlled with imipramine.      Relevant Medications   imipramine (TOFRANIL) 50 MG tablet   Vulvodynia   Relevant  Medications   HYDROcodone-acetaminophen (NORCO) 10-325 MG tablet   HYDROcodone-acetaminophen (NORCO) 10-325 MG tablet   HYDROcodone-acetaminophen (NORCO) 10-325 MG tablet   imipramine (TOFRANIL) 50 MG tablet    Other Visit Diagnoses    Herpes zoster without complication    -  Primary - high dose of valtrex - pt has had before and she knows what it felt like then and thi is similar.   Relevant Medications   valACYclovir (VALTREX) 1000 MG tablet   Fever blister       Relevant Medications   valACYclovir (VALTREX) 1000 MG tablet   Screening, lipid          [name] PA-C  Primary Care at [facility] 03/10/2017 8:28 PM

## 2017-03-10 NOTE — Assessment & Plan Note (Signed)
Well controlled.  Continue q3 months visits

## 2017-03-10 NOTE — Assessment & Plan Note (Signed)
Controlled with imipramine.

## 2017-03-10 NOTE — Assessment & Plan Note (Signed)
Refilled medications today.  Sues Valtrex prn.

## 2017-03-10 NOTE — Assessment & Plan Note (Signed)
Controlled with imipramine. 

## 2017-03-10 NOTE — Assessment & Plan Note (Signed)
Controlled on chronic pain management

## 2017-05-27 ENCOUNTER — Ambulatory Visit (INDEPENDENT_AMBULATORY_CARE_PROVIDER_SITE_OTHER): Payer: 59 | Admitting: Physician Assistant

## 2017-05-27 ENCOUNTER — Encounter: Payer: Self-pay | Admitting: Physician Assistant

## 2017-05-27 VITALS — BP 121/71 | HR 91 | Temp 98.9°F | Resp 18 | Ht 65.0 in | Wt 130.4 lb

## 2017-05-27 DIAGNOSIS — K5903 Drug induced constipation: Secondary | ICD-10-CM | POA: Diagnosis not present

## 2017-05-27 DIAGNOSIS — N94819 Vulvodynia, unspecified: Secondary | ICD-10-CM

## 2017-05-27 MED ORDER — HYDROCODONE-ACETAMINOPHEN 10-325 MG PO TABS: 1.0000 | ORAL_TABLET | ORAL | 0 refills | Status: DC

## 2017-05-27 MED ORDER — NALOXEGOL OXALATE 12.5 MG PO TABS: 12.5000 mg | ORAL_TABLET | Freq: Every day | ORAL | 0 refills | Status: DC

## 2017-05-27 NOTE — Progress Notes (Signed)
Jennifer French  MRN: [id number] DOB: 11/02/1971  PCP: Jennifer French  Chief Complaint  Patient presents with  . Medication Refill    pain meds     Subjective:  Pt presents to clinic for medication refill.  She is doing well on her medications.  The pain is as controlled as she knows it is going to be.  She is having problems with constipation - she passes small hard balls and then once in a while she passes a very large hard stool that is really starting to worry her.  When she has these hard stools her pelvic pain is almost unbearable.  She has tried many things for her constipation and nothing really works.    Still has the numb area in her right shoulder blade but it does not bother her. - She took the valtrex.   Review of Systems  Gastrointestinal: Positive for constipation.  Genitourinary: Positive for dyspareunia.  Psychiatric/Behavioral: Positive for dysphoric mood (stable). The patient is nervous/anxious.     Patient Active Problem List   Diagnosis Date Noted  . Primary HSV infection of mouth 03/08/2017  . Chronic pain syndrome 07/07/2016  . Depression 12/24/2015  . Pudendal neuralgia 03/04/2015  . Nerve pain 12/28/2014  . Vulvodynia 09/01/2013  . Chronic back pain 08/20/2013  . Migraines 12/01/2012  . IBS (irritable bowel syndrome) 12/01/2012  . SMOKER 08/17/2009  . NEPHROLITHIASIS 08/17/2009    Current Outpatient Prescriptions on File Prior to Visit  Medication Sig Dispense Refill  . cyclobenzaprine (FLEXERIL) 10 MG tablet Take 1 tablet (10 mg total) by mouth 3 (three) times daily as needed. for muscle spams 90 tablet 3  . imipramine (TOFRANIL) 50 MG tablet Take 3 tablets (150 mg total) by mouth at bedtime. 180 tablet 1  . Lidocaine, Anorectal, 5 % GEL Apply 1 application topically 3 (three) times daily. 30 g 12   No current facility-administered medications on file prior to visit.     No Known Allergies  Pt patients past, family and social  history were reviewed and updated.   Objective:  BP 121/71   Pulse 91   Temp 98.9 F (37.2 C) (Oral)   Resp 18   Ht 5\' 5"  (1.651 m)   Wt 130 lb 6.4 oz (59.1 kg)   LMP 05/27/2017   SpO2 98%   BMI 21.70 kg/m   Physical Exam  Constitutional: She is oriented to person, place, and time and well-developed, well-nourished, and in no distress.  HENT:  Head: Normocephalic and atraumatic.  Right Ear: Hearing and external ear normal.  Left Ear: Hearing and external ear normal.  Eyes: Conjunctivae are normal.  Neck: Normal range of motion.  Cardiovascular: Normal rate, regular rhythm and normal heart sounds.   No murmur heard. Pulmonary/Chest: Effort normal and breath sounds normal. She has no wheezes.  Neurological: She is alert and oriented to person, place, and time. Gait normal.  Skin: Skin is warm and dry.  Psychiatric: Mood, memory, affect and judgment normal.  Vitals reviewed.   Assessment and Plan :  Drug-induced constipation - Plan: naloxegol oxalate (MOVANTIK) 12.5 MG TABS tablet  Vulvodynia - Plan: HYDROcodone-acetaminophen (NORCO) 10-325 MG tablet, HYDROcodone-acetaminophen (NORCO) 10-325 MG tablet, HYDROcodone-acetaminophen (NORCO) 10-325 MG tablet   Refilled pain medications - continue current medications - trial of Movantik to see if that will help her opioid constipation - let me know if this works and I will refill medication based on dose that helps.  Jennifer French  Primary Care at [facility] 05/27/2017 6:43 PM

## 2017-05-27 NOTE — Patient Instructions (Signed)
     IF you received an x-ray today, you will receive an invoice from [facility]. Please contact [facility] at [phone] with questions or concerns regarding your invoice.   IF you received labwork today, you will receive an invoice from [facility]. Please contact [facility] at [phone] with questions or concerns regarding your invoice.   Our billing staff will not be able to assist you with questions regarding bills from these companies.  You will be contacted with the lab results as soon as they are available. The fastest way to get your results is to activate your My Chart account. Instructions are located on the last page of this paperwork. If you have not heard from us regarding the results in 2 weeks, please contact this office.     

## 2017-05-29 ENCOUNTER — Encounter: Payer: Self-pay | Admitting: Physician Assistant

## 2017-06-04 ENCOUNTER — Telehealth: Payer: Self-pay

## 2017-06-04 NOTE — Telephone Encounter (Signed)
Movantik PA Approved

## 2017-06-04 NOTE — Telephone Encounter (Signed)
[facility]

## 2017-07-07 ENCOUNTER — Other Ambulatory Visit: Payer: Self-pay | Admitting: Physician Assistant

## 2017-07-07 DIAGNOSIS — N94819 Vulvodynia, unspecified: Secondary | ICD-10-CM

## 2017-07-07 DIAGNOSIS — M792 Neuralgia and neuritis, unspecified: Secondary | ICD-10-CM

## 2017-08-16 ENCOUNTER — Encounter: Payer: Self-pay | Admitting: Physician Assistant

## 2017-08-16 ENCOUNTER — Ambulatory Visit (INDEPENDENT_AMBULATORY_CARE_PROVIDER_SITE_OTHER): Payer: 59 | Admitting: Physician Assistant

## 2017-08-16 VITALS — BP 110/72 | HR 94 | Temp 98.6°F | Resp 18 | Ht 65.0 in | Wt 130.8 lb

## 2017-08-16 DIAGNOSIS — N94819 Vulvodynia, unspecified: Secondary | ICD-10-CM | POA: Diagnosis not present

## 2017-08-16 DIAGNOSIS — M792 Neuralgia and neuritis, unspecified: Secondary | ICD-10-CM | POA: Diagnosis not present

## 2017-08-16 DIAGNOSIS — G894 Chronic pain syndrome: Secondary | ICD-10-CM

## 2017-08-16 DIAGNOSIS — R51 Headache: Secondary | ICD-10-CM | POA: Diagnosis not present

## 2017-08-16 DIAGNOSIS — K5903 Drug induced constipation: Secondary | ICD-10-CM

## 2017-08-16 DIAGNOSIS — R519 Headache, unspecified: Secondary | ICD-10-CM

## 2017-08-16 DIAGNOSIS — Z23 Encounter for immunization: Secondary | ICD-10-CM | POA: Diagnosis not present

## 2017-08-16 DIAGNOSIS — F525 Vaginismus not due to a substance or known physiological condition: Secondary | ICD-10-CM | POA: Insufficient documentation

## 2017-08-16 MED ORDER — HYDROCODONE-ACETAMINOPHEN 10-325 MG PO TABS: 1.0000 | ORAL_TABLET | ORAL | 0 refills | Status: DC

## 2017-08-16 MED ORDER — IMIPRAMINE HCL 50 MG PO TABS: 150.0000 mg | ORAL_TABLET | Freq: Every day | ORAL | 4 refills | Status: DC

## 2017-08-16 NOTE — Patient Instructions (Signed)
     IF you received an x-ray today, you will receive an invoice from [facility]. Please contact [facility] at [phone] with questions or concerns regarding your invoice.   IF you received labwork today, you will receive an invoice from [facility]. Please contact [facility] at [phone] with questions or concerns regarding your invoice.   Our billing staff will not be able to assist you with questions regarding bills from these companies.  You will be contacted with the lab results as soon as they are available. The fastest way to get your results is to activate your My Chart account. Instructions are located on the last page of this paperwork. If you have not heard from us regarding the results in 2 weeks, please contact this office.     

## 2017-08-16 NOTE — Progress Notes (Signed)
Jennifer French  MRN: [id number] DOB: 03-Mar-1971  PCP: Morrell Riddle, PA-C  Chief Complaint  Patient presents with  . Medication Refill    all meds     Subjective:  Pt presents to clinic for medication refill.  She has been doing well.  Her medications are helping her function in her job and at home.  She still wakes up every morning in pain.  She feels numb - she now understands from a conference that she went to that she likely has pain caused PTSD.  She is ok with it but it makes her sad - she feels like emotionally she is numb.  She did have problems because her imipramine was reduced by accident so she went from  to  a day and she went through withdraw and she would like to get back on her dose which helps her headaches and her mood.  She tried to get movantik filled but it is to expensive - she is having to change insurance with her job so she is hoping that it will be cheaper with her new insurance because when she is constipated she does have increased pain.  History is obtained by patient.  Review of Systems  Genitourinary: Positive for vaginal pain (constant).  Neurological: Positive for headaches (since stopping her imipramine).  Psychiatric/Behavioral: Negative for sleep disturbance.    Patient Active Problem List   Diagnosis Date Noted  . Primary HSV infection of mouth 03/08/2017  . Chronic pain syndrome 07/07/2016  . Depression 12/24/2015  . Pudendal neuralgia 03/04/2015  . Nerve pain 12/28/2014  . Vulvodynia 09/01/2013  . Chronic back pain 08/20/2013  . Migraines 12/01/2012  . IBS (irritable bowel syndrome) 12/01/2012  . SMOKER 08/17/2009  . NEPHROLITHIASIS 08/17/2009    Current Outpatient Prescriptions on File Prior to Visit  Medication Sig Dispense Refill  . cyclobenzaprine (FLEXERIL) 10 MG tablet Take 1 tablet (10 mg total) by mouth 3 (three) times daily as needed. for muscle spams 90 tablet 3  . Lidocaine, Anorectal, 5 % GEL Apply 1  application topically 3 (three) times daily. 30 g 12  . naloxegol oxalate (MOVANTIK) 12.5 MG TABS tablet Take 1 tablet (12.5 mg total) by mouth daily. (Patient not taking: Reported on 08/16/2017) 30 tablet 0   No current facility-administered medications on file prior to visit.     No Known Allergies  Past Medical History:  Diagnosis Date  . Irritable bowel syndrome (IBS)   . 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 Smoking status: Current Every Day Smoker    Packs/day: 0.50    Years: 18.00    Types: Cigarettes  . Smokeless tobacco: Never Used  . Alcohol use No   family history includes Breast cancer (age of onset: 74) in her mother; Crohn's disease in her father; Diabetes in her father; Hypertension in her father.     Objective:  BP 110/72   Pulse 94   Temp 98.6 F (37 C) (Oral)   Resp 18   Ht  (1.651 m)   Wt 130 lb 12.8 oz (59.3 kg)   LMP 07/23/2017   SpO2 98%   BMI 21.77 kg/m  Body mass index is 21.77 kg/m.  Physical Exam  Constitutional: She is oriented to person, place, and time and well-developed, well-nourished, and in no  distress.  HENT:  Head: Normocephalic and atraumatic.  Right Ear: Hearing and external ear normal.  Left Ear: Hearing and external ear normal.  Eyes: Conjunctivae are normal.  Neck: Normal range of motion.  Pulmonary/Chest: Effort normal.  Neurological: She is alert and oriented to person, place, and time. Gait normal.  Skin: Skin is warm and dry.  Psychiatric: Mood, memory, affect and judgment normal.  Vitals reviewed.   Assessment and Plan :  Vulvodynia - Plan: ToxASSURE Select 13 (MW), Urine, HYDROcodone-acetaminophen (NORCO) 10-325 MG tablet, HYDROcodone-acetaminophen (NORCO) 10-325 MG tablet, HYDROcodone-acetaminophen (NORCO) 10-325 MG tablet, imipramine (TOFRANIL) 50 MG  tablet  Need for influenza vaccination - Plan: Flu Vaccine QUAD 36+ mos IM  Nerve pain - Plan: ToxASSURE Select 13 (MW), Urine, imipramine (TOFRANIL) 50 MG tablet  Chronic pain syndrome  Nonintractable episodic headache, unspecified headache type  Drug induced constipation   Continue current medications - do her yearly drug screen.  D/w pt PTSD related to pain and that is likely real.  Hopefully the movantik will be covered better on her new insurance plan.  She was given a coupon to help with the cost.   Sarah Weber Benny Lennertimary Care at [facility] 08/16/2017 3:24 PM

## 2017-08-22 LAB — TOXASSURE SELECT 13 (MW), URINE

## 2017-11-18 ENCOUNTER — Other Ambulatory Visit: Payer: Self-pay | Admitting: Physician Assistant

## 2017-11-18 DIAGNOSIS — M792 Neuralgia and neuritis, unspecified: Secondary | ICD-10-CM

## 2017-11-18 DIAGNOSIS — N94819 Vulvodynia, unspecified: Secondary | ICD-10-CM

## 2017-11-18 NOTE — Telephone Encounter (Signed)
Copied from CRM [id number]. Topic: Quick Communication - Rx Refill/Question >> Nov 18, 2017 12:53 PM Crist InfanteHarrald, Kathy J wrote: Pt was sent message to reschedule appt with Weber, but pt will be out of meds until the next available time Weber can see her. Pt requesting 30 days to get through  Pt has declined to see another MD, stating she doesn't see anyone but Weber.  HYDROcodone-acetaminophen (NORCO) 10-325 MG tablet cyclobenzaprine (FLEXERIL) 10 MG tablet imipramine (TOFRANIL) 50 MG tablet   [address] [address] [place], [place] - [id number] [phone] (Phone) [phone] (Fax)

## 2017-11-20 ENCOUNTER — Encounter: Payer: Self-pay | Admitting: Physician Assistant

## 2017-11-20 ENCOUNTER — Other Ambulatory Visit: Payer: Self-pay | Admitting: Physician Assistant

## 2017-11-20 DIAGNOSIS — N94819 Vulvodynia, unspecified: Secondary | ICD-10-CM

## 2017-11-21 MED ORDER — CYCLOBENZAPRINE HCL 10 MG PO TABS: 10.0000 mg | ORAL_TABLET | Freq: Three times a day (TID) | ORAL | 0 refills | Status: DC | PRN

## 2017-11-21 MED ORDER — IMIPRAMINE HCL 50 MG PO TABS: 150.0000 mg | ORAL_TABLET | Freq: Every day | ORAL | 0 refills | Status: DC

## 2017-11-21 MED ORDER — HYDROCODONE-ACETAMINOPHEN 10-325 MG PO TABS: 1.0000 | ORAL_TABLET | ORAL | 0 refills | Status: DC

## 2017-11-21 NOTE — Telephone Encounter (Signed)
Patient notified via My Chart. Rx sent electronically.  Meds ordered this encounter  Medications  . HYDROcodone-acetaminophen (NORCO) 10-325 MG tablet    Sig: Take 1 tablet by mouth every 4 (four) hours.    Dispense:  180 tablet    Refill:  0    Order Specific Question:   Supervising Provider    Answer:   SHAW, EVA N [[id number]]  . cyclobenzaprine (FLEXERIL) 10 MG tablet    Sig: Take 1 tablet (10 mg total) by mouth 3 (three) times daily as needed. for muscle spams    Dispense:  90 tablet    Refill:  0    Please consider 90 day supplies to promote better adherence    Order Specific Question:   Supervising Provider    Answer:   Clelia CroftSHAW, EVA N [[id number]]  . imipramine (TOFRANIL) 50 MG tablet    Sig: Take 3 tablets (150 mg total) by mouth daily. Office visit needed for refills    Dispense:  270 tablet    Refill:  0    Please consider 90 day supplies to promote better adherence    Order Specific Question:   Supervising Provider    Answer:   Clelia CroftSHAW, EVA N [[id number]]

## 2017-11-26 ENCOUNTER — Ambulatory Visit: Payer: 59 | Admitting: Physician Assistant

## 2017-11-27 ENCOUNTER — Telehealth: Payer: Self-pay

## 2017-11-27 NOTE — Telephone Encounter (Signed)
Jennifer GammaSarah, Started PA for patient's Hydrocodone today, however, she has a new insurance that requires PA every 7 days according to patient's pharmacy. Her PA's are very lengthy, and I am not sure if doing them every 7 days is the most efficient means for the patient or staff.  What are your thoughts on this? Kirsi Hugh

## 2017-11-28 NOTE — Telephone Encounter (Signed)
I completely agree.  Can we call and talk to insurance about her length of use?  I do not want to do a PA every week that is a waste of your time and not fair to the patient.  The patient is very reasonable - she may know what is going on and I am sure will be willing to call her insurance to see what we need to do to have this approved for longer - it may be something simple to write her diagnosis with chronic pain.

## 2017-12-04 ENCOUNTER — Encounter: Payer: Self-pay | Admitting: Physician Assistant

## 2017-12-04 ENCOUNTER — Ambulatory Visit: Payer: BC Managed Care – PPO | Admitting: Physician Assistant

## 2017-12-04 ENCOUNTER — Other Ambulatory Visit: Payer: Self-pay

## 2017-12-04 VITALS — BP 118/64 | HR 84 | Temp 99.2°F | Resp 18 | Ht 66.34 in | Wt 134.6 lb

## 2017-12-04 DIAGNOSIS — N94819 Vulvodynia, unspecified: Secondary | ICD-10-CM | POA: Diagnosis not present

## 2017-12-04 DIAGNOSIS — R21 Rash and other nonspecific skin eruption: Secondary | ICD-10-CM

## 2017-12-04 MED ORDER — HYDROCODONE-ACETAMINOPHEN 10-325 MG PO TABS: 1.0000 | ORAL_TABLET | ORAL | 0 refills | Status: DC

## 2017-12-04 MED ORDER — HYDROCODONE-ACETAMINOPHEN 10-325 MG PO TABS
1.0000 | ORAL_TABLET | ORAL | 0 refills | Status: DC
Start: 2018-01-04 — End: 2018-02-18

## 2017-12-04 MED ORDER — KETOCONAZOLE 2 % EX CREA: 1.0000 "application " | TOPICAL_CREAM | Freq: Two times a day (BID) | CUTANEOUS | 1 refills | Status: DC

## 2017-12-04 NOTE — Patient Instructions (Signed)
     IF you received an x-ray today, you will receive an invoice from [facility]. Please contact [facility] at [phone] with questions or concerns regarding your invoice.   IF you received labwork today, you will receive an invoice from [facility]. Please contact [facility] at [phone] with questions or concerns regarding your invoice.   Our billing staff will not be able to assist you with questions regarding bills from these companies.  You will be contacted with the lab results as soon as they are available. The fastest way to get your results is to activate your My Chart account. Instructions are located on the last page of this paperwork. If you have not heard from us regarding the results in 2 weeks, please contact this office.     

## 2017-12-04 NOTE — Progress Notes (Signed)
Jennifer French  MRN: [id number] DOB: [birth year]  PCP: Morrell Riddle, PA-C  Chief Complaint  Patient presents with  . Medication Refill    Norco    Subjective:  Pt presents to clinic for medication refill.  Her pain is controlled so she can work and function.  She has notices a rash in her anterior chest area where her bra sits - started as a fine rash that did not bother her and now it is up to 1 cm lesions that are flat and do not bother her other than they are there.  They have not spread that she knows of.  She has used nothing on the lesions.  History is obtained by patient.  Review of Systems  Genitourinary: Positive for pelvic pain (chronic).  Skin: Positive for rash. Negative for color change.    Patient Active Problem List   Diagnosis Date Noted  . 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 Primary HSV infection of mouth 03/08/2017  . Chronic pain syndrome 07/07/2016  . Depression 12/24/2015  . Pudendal neuralgia 03/04/2015  . Nerve pain 12/28/2014  . Vulvodynia 09/01/2013  . Chronic back pain 08/20/2013  . Migraines 12/01/2012  . IBS (irritable bowel syndrome) 12/01/2012  . SMOKER 08/17/2009  . NEPHROLITHIASIS 08/17/2009    Current Outpatient Medications on File Prior to Visit  Medication Sig Dispense Refill  . cyclobenzaprine (FLEXERIL) 10 MG tablet Take 1 tablet (10 mg total) by mouth 3 (three) times daily as needed. for muscle spams 90 tablet 0  . imipramine (TOFRANIL) 50 MG tablet Take 3 tablets (150 mg total) by mouth daily. Office visit needed for refills 270 tablet 0  . Lidocaine, Anorectal, 5 % GEL Apply 1 application topically 3 (three) times daily. 30 g 12  . naloxegol oxalate (MOVANTIK) 12.5 MG TABS tablet Take 1 tablet (12.5 mg total) by mouth daily. 30 tablet 0   No current facility-administered medications on file prior to visit.     No Known Allergies  Past Medical History:  Diagnosis Date  . Irritable bowel syndrome (IBS)   . 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 Smoking status: Current Every Day Smoker    Packs/day: 0.50    Years: 18.00    Pack years: 9.00    Types: Cigarettes  . Smokeless tobacco: Never Used  Substance Use Topics  . Alcohol use: No    Alcohol/week: 0.0 oz  . Drug use: No   family history includes Breast cancer (age of onset: 15) in her mother; Crohn's disease in her father; Diabetes in her father; Hypertension in her father.     Objective:  BP 118/64 (BP Location: Left Arm, Patient Position: Sitting, Cuff Size: Normal)   Pulse 84   Temp 99.2 F (37.3 C) (Oral)   Resp 18   Ht 5' 6.34" (1.685 m)   Wt 134 lb 9.6 oz (61.1 kg)   LMP 12/03/2017 (Exact Date)   SpO2 99%   BMI 21.50 kg/m  Body mass index is 21.5 kg/m.  Physical Exam  Constitutional: She is oriented to person, place, and time and well-developed, well-nourished, and in no distress.  HENT:  Head: Normocephalic and atraumatic.  Right Ear: Hearing and external ear normal.  Left Ear: Hearing and external ear normal.  Eyes: Conjunctivae are normal.  Neck: Normal range  of motion.  Pulmonary/Chest: Effort normal.  Neurological: She is alert and oriented to person, place, and time. Gait normal.  Skin: Skin is warm and dry.  Flat mildly erythematous lesions with some scaling and central clearing in anterior chest aligned with location of bra - there is one lesion on her right breast  Psychiatric: Mood, memory, affect and judgment normal.  Vitals reviewed.   Assessment and Plan :  Rash and nonspecific skin eruption - Plan: ketoconazole (NIZORAL) 2 % cream - appears fungal will use topical treatment  Vulvodynia - Plan: HYDROcodone-acetaminophen (NORCO) 10-325 MG tablet, HYDROcodone-acetaminophen (NORCO) 10-325 MG tablet, HYDROcodone-acetaminophen (NORCO) 10-325 MG tablet -  refilled pain medications - she is having trouble with her insurance covering her pain medication - she paid cash last month she is changing insurance companies back to her old one due to job change.  Benny LennertSarah Sabrine Patchen PA-C  [facility] 12/04/2017 1:37 PM

## 2017-12-05 NOTE — Telephone Encounter (Signed)
Spoke to patient regarding her insurance and the need for weekly approval.  Explained that it is not an ideal thing to do and requested she call the insurance.  She told me her insurance is changing on February 1st.  [facility]. We will just leave things the way they are until then.

## 2018-02-12 ENCOUNTER — Telehealth: Payer: Self-pay | Admitting: Physician Assistant

## 2018-02-12 NOTE — Telephone Encounter (Signed)
MYCHART MESSAGE SENT TO PT LETTING THEM KNOW THEIR APT FOR 4/6 HAD BEEN CANCELLED DUE TO PROVIDER BEING OUT OF TOWN WILL NEED TO RESCHEDULE ON HER NEXT WORKING DAY  PT CAN ONLY SEE WEBER

## 2018-02-14 ENCOUNTER — Ambulatory Visit: Payer: BC Managed Care – PPO | Admitting: Physician Assistant

## 2018-02-18 ENCOUNTER — Encounter: Payer: Self-pay | Admitting: Physician Assistant

## 2018-02-18 ENCOUNTER — Ambulatory Visit: Payer: BC Managed Care – PPO | Admitting: Physician Assistant

## 2018-02-18 ENCOUNTER — Other Ambulatory Visit: Payer: Self-pay

## 2018-02-18 VITALS — BP 112/64 | HR 91 | Temp 99.4°F | Resp 18 | Ht 66.34 in | Wt 136.0 lb

## 2018-02-18 DIAGNOSIS — N94819 Vulvodynia, unspecified: Secondary | ICD-10-CM

## 2018-02-18 DIAGNOSIS — R21 Rash and other nonspecific skin eruption: Secondary | ICD-10-CM

## 2018-02-18 DIAGNOSIS — K5903 Drug induced constipation: Secondary | ICD-10-CM | POA: Diagnosis not present

## 2018-02-18 DIAGNOSIS — B49 Unspecified mycosis: Secondary | ICD-10-CM | POA: Diagnosis not present

## 2018-02-18 DIAGNOSIS — M792 Neuralgia and neuritis, unspecified: Secondary | ICD-10-CM | POA: Diagnosis not present

## 2018-02-18 LAB — POCT SKIN KOH: Skin KOH, POC: POSITIVE — AB

## 2018-02-18 MED ORDER — HYDROCODONE-ACETAMINOPHEN 10-325 MG PO TABS: 1.0000 | ORAL_TABLET | ORAL | 0 refills | Status: DC

## 2018-02-18 MED ORDER — CYCLOBENZAPRINE HCL 10 MG PO TABS: 10.0000 mg | ORAL_TABLET | Freq: Three times a day (TID) | ORAL | 0 refills | Status: DC | PRN

## 2018-02-18 MED ORDER — IMIPRAMINE HCL 50 MG PO TABS: 150.0000 mg | ORAL_TABLET | Freq: Every day | ORAL | 3 refills | Status: DC

## 2018-02-18 MED ORDER — METHYLNALTREXONE BROMIDE 150 MG PO TABS: 150.0000 mg | ORAL_TABLET | Freq: Every day | ORAL | 0 refills | Status: DC

## 2018-02-18 NOTE — Patient Instructions (Signed)
     IF you received an x-ray today, you will receive an invoice from [facility]. Please contact [facility] at [phone] with questions or concerns regarding your invoice.   IF you received labwork today, you will receive an invoice from [facility]. Please contact [facility] at [phone] with questions or concerns regarding your invoice.   Our billing staff will not be able to assist you with questions regarding bills from these companies.  You will be contacted with the lab results as soon as they are available. The fastest way to get your results is to activate your My Chart account. Instructions are located on the last page of this paperwork. If you have not heard from us regarding the results in 2 weeks, please contact this office.     

## 2018-02-18 NOTE — Progress Notes (Signed)
Jennifer French  MRN: [id number] DOB: 11-May-1971  PCP: Morrell Riddle, PA-C  Chief Complaint  Patient presents with  . Medication Refill    all meds     Subjective:  Pt presents to clinic for medication refills. She is still doing ok on her current pain medication regimen.  She still has the rash that we treated with ketoconazole - in fact the spots are larger and there are more of them now.  They still do not bother her - no itching or pain.  She is still having significant bloating and constipation - when she passes stool hard pellets.  She has tried many OTC medications without help.  History is obtained by patient.  Review of Systems  Constitutional: Negative for chills.  Genitourinary:       Chronic vaginal pain  Skin: Positive for rash.    Patient Active Problem List   Diagnosis Date Noted  . 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 Primary HSV infection of mouth 03/08/2017  . Chronic pain syndrome 07/07/2016  . Depression 12/24/2015  . Pudendal neuralgia 03/04/2015  . Nerve pain 12/28/2014  . Vulvodynia 09/01/2013  . Chronic back pain 08/20/2013  . Migraines 12/01/2012  . IBS (irritable bowel syndrome) 12/01/2012  . SMOKER 08/17/2009  . NEPHROLITHIASIS 08/17/2009    Current Outpatient Medications on File Prior to Visit  Medication Sig Dispense Refill  . Lidocaine, Anorectal, 5 % GEL Apply 1 application topically 3 (three) times daily. (Patient not taking: Reported on 02/18/2018) 30 g 12   No current facility-administered medications on file prior to visit.     No Known Allergies  Past Medical History:  Diagnosis Date  . Irritable bowel syndrome (IBS)   . 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 Smoking status: Current Every Day Smoker    Packs/day: 0.50    Years: 18.00    Pack years: 9.00    Types:  Cigarettes  . Smokeless tobacco: Never Used  Substance Use Topics  . Alcohol use: No    Alcohol/week: 0.0 oz  . Drug use: No   family history includes Breast cancer (age of onset: 60) in her mother; Crohn's disease in her father; Diabetes in her father; Hypertension in her father.     Objective:  BP 112/64   Pulse 91   Temp 99.4 F (37.4 C) (Oral)   Resp 18   Ht 5' 6.34" (1.685 m)   Wt 136 lb (61.7 kg)   LMP 02/14/2018   SpO2 100%   BMI 21.73 kg/m  Body mass index is 21.73 kg/m.  Physical Exam  Constitutional: She is oriented to person, place, and time and well-developed, well-nourished, and in no distress.  HENT:  Head: Normocephalic and atraumatic.  Right Ear: Hearing and external ear normal.  Left Ear: Hearing and external ear normal.  Eyes: Conjunctivae are normal.  Neck: Normal range of motion.  Cardiovascular: Normal rate, regular rhythm and normal heart sounds.  No murmur heard. Pulmonary/Chest: Effort normal and breath sounds normal. She has no wheezes.  Abdominal: Bowel sounds are normal. She exhibits distension.  Neurological: She is alert and oriented to person, place, and time. Gait normal.  Skin: Skin is warm and dry. Rash (scaling erythematous circular area in bra line area) noted.  Psychiatric: Mood, memory, affect and judgment normal.  Vitals reviewed.  Results for orders placed or performed in visit on 02/18/18  POCT Skin KOH  Result Value Ref Range   Skin KOH, POC Positive (A) Negative    Assessment and Plan :  Drug-induced constipation - Plan: Methylnaltrexone Bromide (RELISTOR) 150 MG TABS - trial of this to help with opiate induced constipation - her insurance would not cover movantik and she has tried all OTC medications without success.  Vulvodynia - Plan: HYDROcodone-acetaminophen (NORCO) 10-325 MG tablet, HYDROcodone-acetaminophen (NORCO) 10-325 MG tablet, HYDROcodone-acetaminophen (NORCO) 10-325 MG tablet, imipramine (TOFRANIL) 50 MG tablet  - refilled pain medications  Nerve pain - Plan: imipramine (TOFRANIL) 50 MG tablet, cyclobenzaprine (FLEXERIL) 10 MG tablet  Rash and nonspecific skin eruption - Plan: POCT Skin KOH, nystatin cream (MYCOSTATIN)  Fungal infection - Plan: nystatin cream (MYCOSTATIN) different antifungal to pharmacy - keep area dry  Benny LennertSarah Sherena Machorro PA-C  Primary Care at [facility] 02/19/2018 11:45 AM

## 2018-02-19 ENCOUNTER — Encounter: Payer: Self-pay | Admitting: Physician Assistant

## 2018-02-19 MED ORDER — NYSTATIN 100000 UNIT/GM EX CREA: 1.0000 "application " | TOPICAL_CREAM | Freq: Two times a day (BID) | CUTANEOUS | 0 refills | Status: AC

## 2018-03-04 ENCOUNTER — Telehealth: Payer: Self-pay | Admitting: Physician Assistant

## 2018-03-04 NOTE — Telephone Encounter (Signed)
Incoming fax dated 03/03/2017 from [employer] [facility] [employer] [place] [employer], [place] regarding Relistor 150mg  tablets. Message on fax states, "Please note this patient has a State PPO policy (YPYW prefix), all pharmacy inquiries are handled directly through [facility] at [phone]".   CoverMyMeds prior auth completed on 02/27/18 Key: [id number]. Awaiting response from plan.

## 2018-03-07 NOTE — Telephone Encounter (Signed)
Fax for Relistor 150mg  authorization received from [employer], placed in provider box for completion/signature.

## 2018-03-12 NOTE — Telephone Encounter (Signed)
Forms filled out and faxed back

## 2018-03-14 NOTE — Telephone Encounter (Addendum)
Incoming fax received from [employer] dated 03/13/18, coverage approved for Relistor  until 09/10/2018.   Phone call to pharmacy, pharmacist notified. Pharmacist states that medication is having trouble running through the insurance information they have on file- may not have up to date information.  Mychart message sent to patient advising of approval.

## 2018-04-01 ENCOUNTER — Other Ambulatory Visit: Payer: Self-pay | Admitting: Physician Assistant

## 2018-04-01 ENCOUNTER — Encounter: Payer: Self-pay | Admitting: Physician Assistant

## 2018-04-01 MED ORDER — NALOXEGOL OXALATE 12.5 MG PO TABS: 12.5000 mg | ORAL_TABLET | Freq: Every day | ORAL | 0 refills | Status: DC

## 2018-04-01 NOTE — Progress Notes (Signed)
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

## 2018-05-10 ENCOUNTER — Other Ambulatory Visit: Payer: Self-pay | Admitting: Physician Assistant

## 2018-05-10 DIAGNOSIS — M792 Neuralgia and neuritis, unspecified: Secondary | ICD-10-CM

## 2018-05-12 NOTE — Telephone Encounter (Signed)
Flexeril refill Last Refill:02/18/18 #90 Last OV: 02/18/18 PCP: Lilia ArgueSarah Weber,PA [facility] [address]

## 2018-06-06 ENCOUNTER — Other Ambulatory Visit: Payer: Self-pay

## 2018-06-06 ENCOUNTER — Encounter: Payer: Self-pay | Admitting: Physician Assistant

## 2018-06-06 ENCOUNTER — Ambulatory Visit: Payer: BC Managed Care – PPO | Admitting: Physician Assistant

## 2018-06-06 VITALS — BP 120/64 | HR 93 | Temp 99.6°F | Resp 18 | Ht 66.34 in | Wt 138.2 lb

## 2018-06-06 DIAGNOSIS — G43009 Migraine without aura, not intractable, without status migrainosus: Secondary | ICD-10-CM | POA: Diagnosis not present

## 2018-06-06 DIAGNOSIS — M792 Neuralgia and neuritis, unspecified: Secondary | ICD-10-CM

## 2018-06-06 DIAGNOSIS — N94819 Vulvodynia, unspecified: Secondary | ICD-10-CM | POA: Diagnosis not present

## 2018-06-06 DIAGNOSIS — G894 Chronic pain syndrome: Secondary | ICD-10-CM | POA: Diagnosis not present

## 2018-06-06 MED ORDER — HYDROCODONE-ACETAMINOPHEN 10-325 MG PO TABS: 1.0000 | ORAL_TABLET | ORAL | 0 refills | Status: AC

## 2018-06-06 MED ORDER — IMIPRAMINE HCL 50 MG PO TABS: 200.0000 mg | ORAL_TABLET | Freq: Every day | ORAL | 3 refills | Status: AC

## 2018-06-06 MED ORDER — CYCLOBENZAPRINE HCL 10 MG PO TABS: 10.0000 mg | ORAL_TABLET | Freq: Every day | ORAL | 3 refills | Status: AC

## 2018-06-06 MED ORDER — IMIPRAMINE HCL 50 MG PO TABS: 200.0000 mg | ORAL_TABLET | Freq: Every day | ORAL | 0 refills | Status: AC

## 2018-06-06 NOTE — Patient Instructions (Addendum)
[facility] - [address], [place], [place] [address] Phone: [phone]   Colace for stool softner - up to 4 a day Miralax - 1-4 doses a day     IF you received an x-ray today, you will receive an invoice from [facility]. Please contact [facility] at [phone] with questions or concerns regarding your invoice.   IF you received labwork today, you will receive an invoice from [facility]. Please contact [facility] at [phone] with questions or concerns regarding your invoice.   Our billing staff will not be able to assist you with questions regarding bills from these companies.  You will be contacted with the lab results as soon as they are available. The fastest way to get your results is to activate your My Chart account. Instructions are located on the last page of this paperwork. If you have not heard from us regarding the results in 2 weeks, please contact this office.

## 2018-06-06 NOTE — Progress Notes (Signed)
Jennifer French  MRN: [id number] DOB: [birth year]  PCP: Morrell RiddleWeber, Sarah L, PA-C  Chief Complaint  Patient presents with  . Medication Refill    hydrocodone refill     Subjective:  Pt presents to clinic for medication refills.  Her pain has gotten a little worse recently.  She would like to go up on her imipramine.  Her pedundal nerve and her migraines recently are worse.  She has imitrex at home and zofran for her migraines and that helps.  Work is still going well for her.  History is obtained by patient.  Review of Systems  Constitutional: Negative for chills and fever.    Patient Active Problem List   Diagnosis Date Noted  . 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 Primary HSV infection of mouth 03/08/2017  . Chronic pain syndrome 07/07/2016  . Depression 12/24/2015  . Pudendal neuralgia 03/04/2015  . Nerve pain 12/28/2014  . Vulvodynia 09/01/2013  . Chronic back pain 08/20/2013  . Migraines 12/01/2012  . IBS (irritable bowel syndrome) 12/01/2012  . SMOKER 08/17/2009  . NEPHROLITHIASIS 08/17/2009    Current Outpatient Medications on File Prior to Visit  Medication Sig Dispense Refill  . Lidocaine, Anorectal, 5 % GEL Apply 1 application topically 3 (three) times daily. 30 g 12  . nystatin cream (MYCOSTATIN) Apply 1 application topically 2 (two) times daily. 30 g 0  . ondansetron (ZOFRAN-ODT) 4 MG disintegrating tablet DISSOLVE 1 TABLET IN MOUTH ONCE DAILY AS NEEDED FOR NAUSEA FROM MIGRAINES  5  . SUMAtriptan (IMITREX) 25 MG tablet TAKE 1 TABLET BY MOUTH TO ABORT MIGRAINE. MAX DAILY DOSE 200 MG  5   No current facility-administered medications on file prior to visit.     No Known Allergies  Past Medical History:  Diagnosis Date  . Irritable bowel syndrome (IBS)   . 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 Smoking  status: Current Every Day Smoker    Packs/day: 0.50    Years: 18.00    Pack years: 9.00    Types: Cigarettes  . Smokeless tobacco: Never Used  Substance Use Topics  . Alcohol use: No    Alcohol/week: 0.0 oz  . Drug use: No   family history includes Breast cancer (age of onset: 4333) in her mother; Crohn's disease in her father; Diabetes in her father; Hypertension in her father.     Objective:  BP 120/64   Pulse 93   Temp 99.6 F (37.6 C) (Oral)   Resp 18   Ht 5' 6.34" (1.685 m)   Wt 138 lb 3.2 oz (62.7 kg)   LMP 05/20/2018   SpO2 100%   BMI 22.08 kg/m  Body mass index is 22.08 kg/m.  Wt Readings from Last 3 Encounters:  06/06/18 138 lb 3.2 oz (62.7 kg)  02/18/18 136 lb (61.7 kg)  12/04/17 134 lb 9.6 oz (61.1 kg)    Physical Exam  Constitutional: She is oriented to person, place, and time. She appears well-developed and well-nourished.  HENT:  Head: Normocephalic and atraumatic.  Right Ear: Hearing and external ear normal.  Left Ear: Hearing and external ear normal.  Eyes: Conjunctivae are normal.  Neck: Normal range of motion.  Pulmonary/Chest: Effort normal.  Neurological: She is alert and oriented to person, place, and time.  Skin: Skin is warm, dry and intact.  Psychiatric: She has a normal mood and affect. Her behavior is normal. Judgment and thought content normal.  Vitals reviewed.   Assessment and Plan :  Vulvodynia - Plan: HYDROcodone-acetaminophen (NORCO) 10-325 MG tablet, HYDROcodone-acetaminophen (NORCO) 10-325 MG tablet, HYDROcodone-acetaminophen (NORCO) 10-325 MG tablet, imipramine (TOFRANIL) 50 MG tablet  Nerve pain - Plan: imipramine (TOFRANIL) 50 MG tablet, imipramine (TOFRANIL) 50 MG tablet, cyclobenzaprine (FLEXERIL) 10 MG tablet -due to worsening pain as well as migraines increase imipramine to 200 mg total.  She can communicate with me through my chart if this is helping.  Is otherwise her appointment for recheck will be in 3 months.  Chronic  pain syndrome -continue pain medication as she has for the last several years.  Migraine without aura and without status migrainosus, not intractable -she has her medications for her migraines at home.  Patient verbalized to me that they understand the following: diagnosis, what is being done for them, what to expect and what should be done at home.  Their questions have been answered.  See after visit summary for patient specific instructions.  [name] PA-C  Primary Care at [facility] 06/07/2018 7:54 AM  Please note: Portions of this report may have been transcribed using dragon voice recognition software. Every effort was made to ensure accuracy; however, inadvertent computerized transcription errors may be present.

## 2018-06-07 ENCOUNTER — Encounter: Payer: Self-pay | Admitting: Physician Assistant

## 2018-08-18 ENCOUNTER — Other Ambulatory Visit: Payer: Self-pay | Admitting: Physician Assistant

## 2018-08-18 DIAGNOSIS — M792 Neuralgia and neuritis, unspecified: Secondary | ICD-10-CM

## 2020-10-17 ENCOUNTER — Other Ambulatory Visit: Payer: Self-pay | Admitting: Obstetrics and Gynecology
